# Patient Record
Sex: Male | Born: 1965
Health system: Southern US, Community
[De-identification: ages and names within clinical notes are randomized; demographics above are authoritative.]

## PROBLEM LIST (undated history)

## (undated) DIAGNOSIS — C4490 Unspecified malignant neoplasm of skin, unspecified: Secondary | ICD-10-CM

## (undated) DIAGNOSIS — E7800 Pure hypercholesterolemia, unspecified: Secondary | ICD-10-CM

## (undated) DIAGNOSIS — I10 Essential (primary) hypertension: Secondary | ICD-10-CM

## (undated) HISTORY — DX: Pure hypercholesterolemia, unspecified: E78.00

## (undated) HISTORY — DX: Essential (primary) hypertension: I10

---

## 1996-11-02 HISTORY — PX: BACK SURGERY: SHX140

## 2007-11-05 ENCOUNTER — Emergency Department (HOSPITAL_COMMUNITY): Admission: EM | Admit: 2007-11-05 | Discharge: 2007-11-05 | Payer: Self-pay | Admitting: Emergency Medicine

## 2007-12-12 ENCOUNTER — Emergency Department (HOSPITAL_COMMUNITY): Admission: EM | Admit: 2007-12-12 | Discharge: 2007-12-12 | Payer: Self-pay | Admitting: Family Medicine

## 2007-12-14 ENCOUNTER — Emergency Department (HOSPITAL_COMMUNITY): Admission: EM | Admit: 2007-12-14 | Discharge: 2007-12-14 | Payer: Self-pay | Admitting: Family Medicine

## 2007-12-15 ENCOUNTER — Ambulatory Visit (HOSPITAL_COMMUNITY): Admission: EM | Admit: 2007-12-15 | Discharge: 2007-12-16 | Payer: Self-pay | Admitting: Emergency Medicine

## 2008-01-26 ENCOUNTER — Emergency Department (HOSPITAL_COMMUNITY): Admission: EM | Admit: 2008-01-26 | Discharge: 2008-01-26 | Payer: Self-pay | Admitting: Emergency Medicine

## 2008-09-16 IMAGING — CR DG FINGER MIDDLE 2+V*R*
3 series · 3 of 3 positions shown · non-contrast
Comparison: none

CLINICAL DATA: Sore on finger two days ago.  Now swollen and infected.
 RIGHT MIDDLE FINGER SERIES ? 3 VIEWS ? 12/15/07:

[x finger pa right]
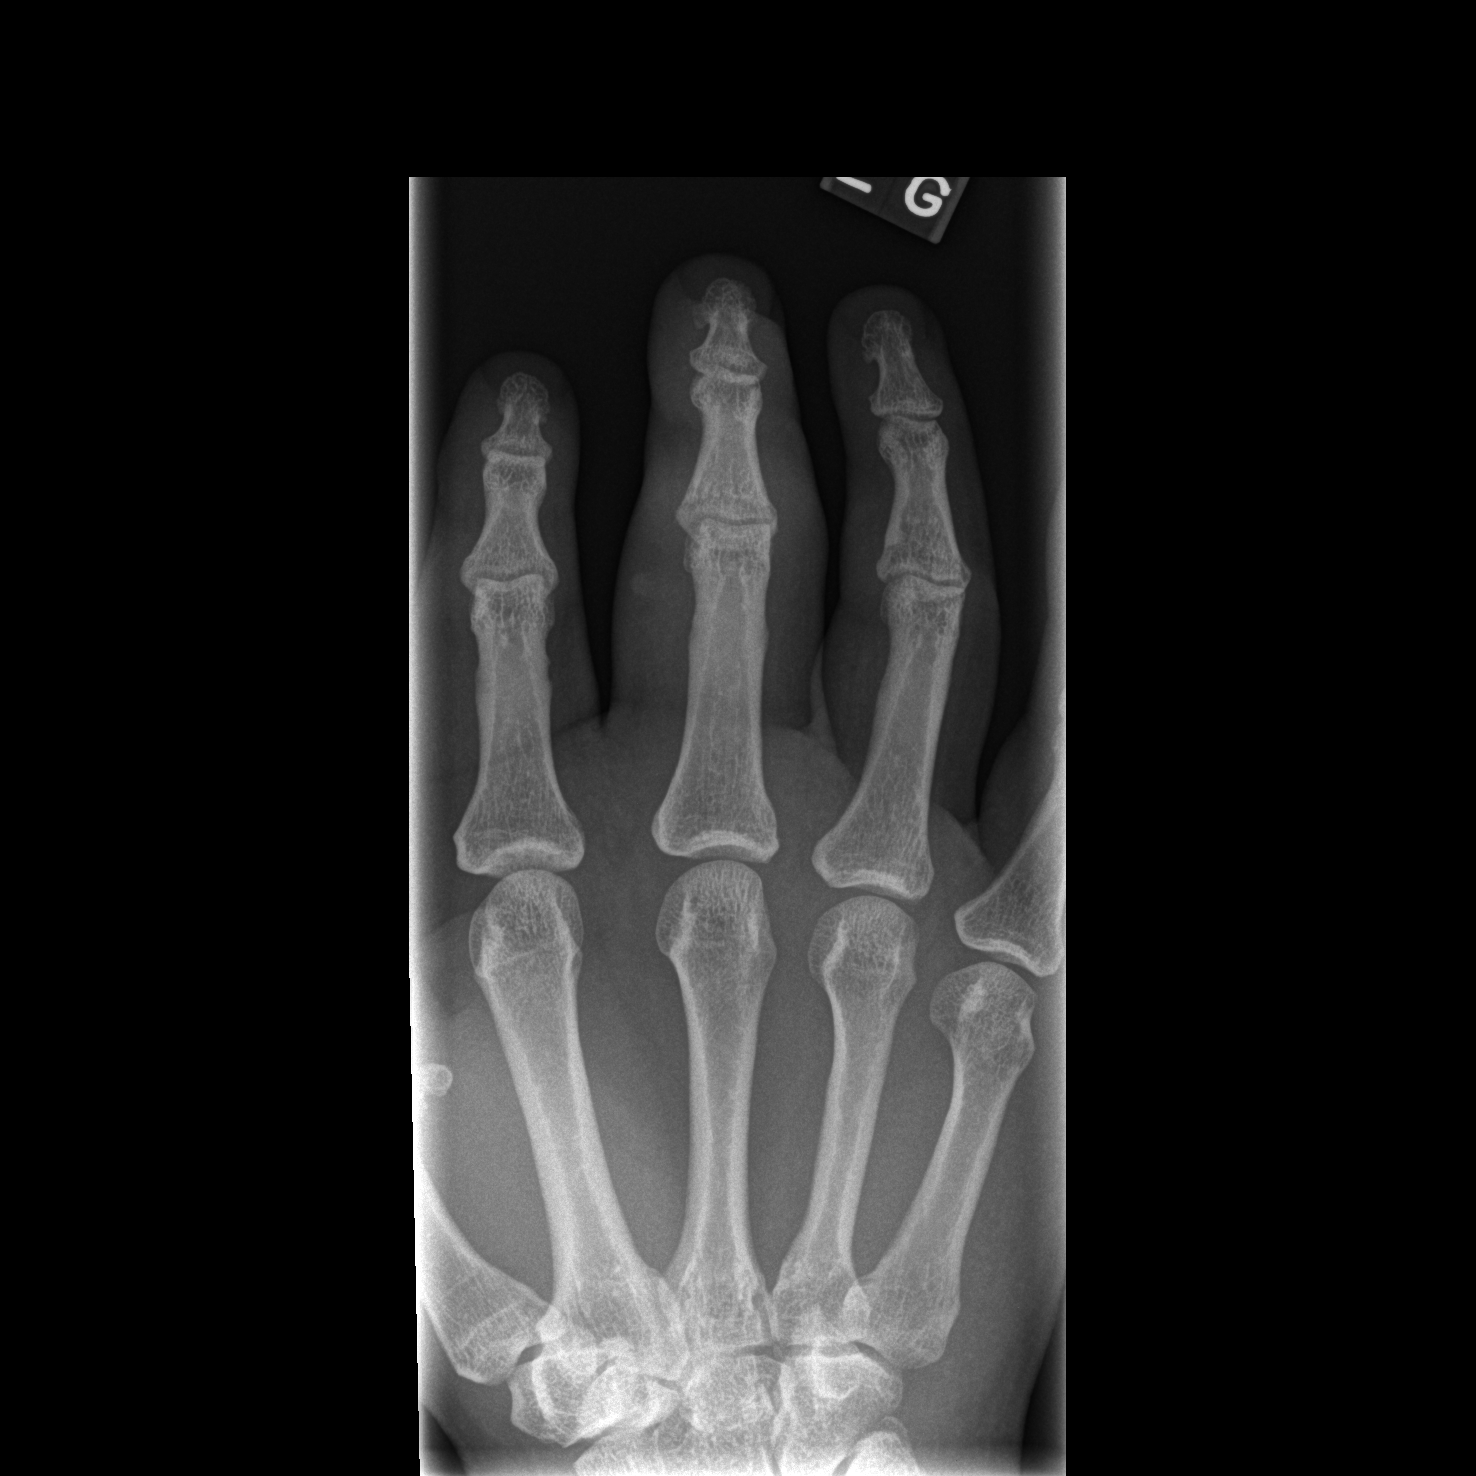

[x finger obl. right]
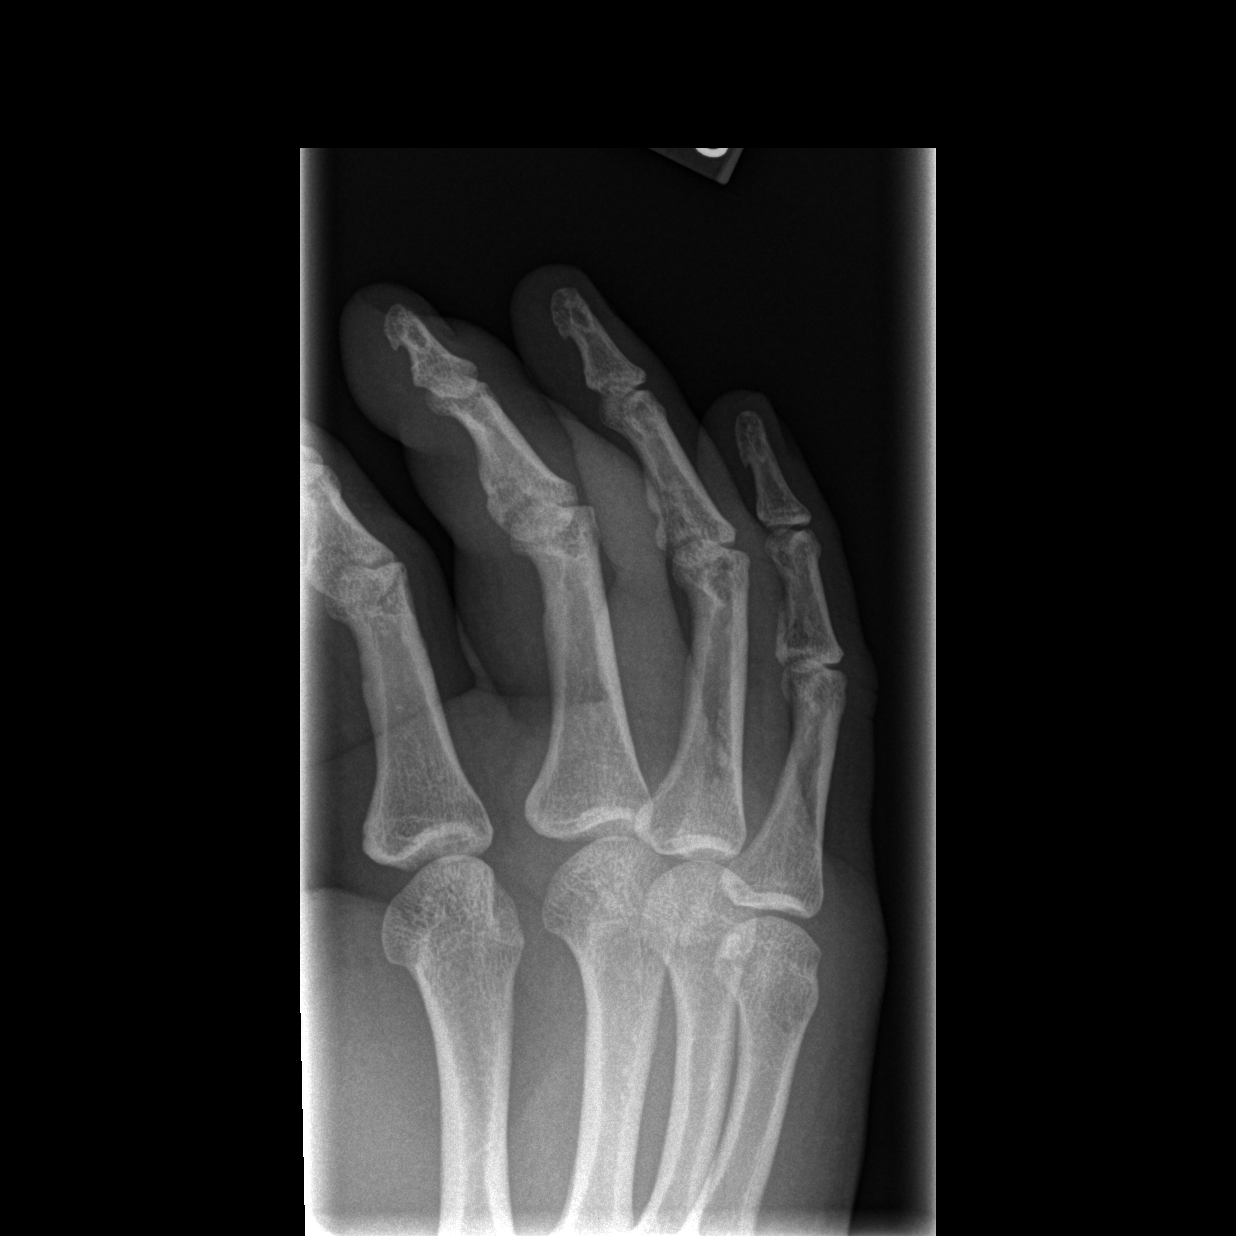

[x finger lateral right]
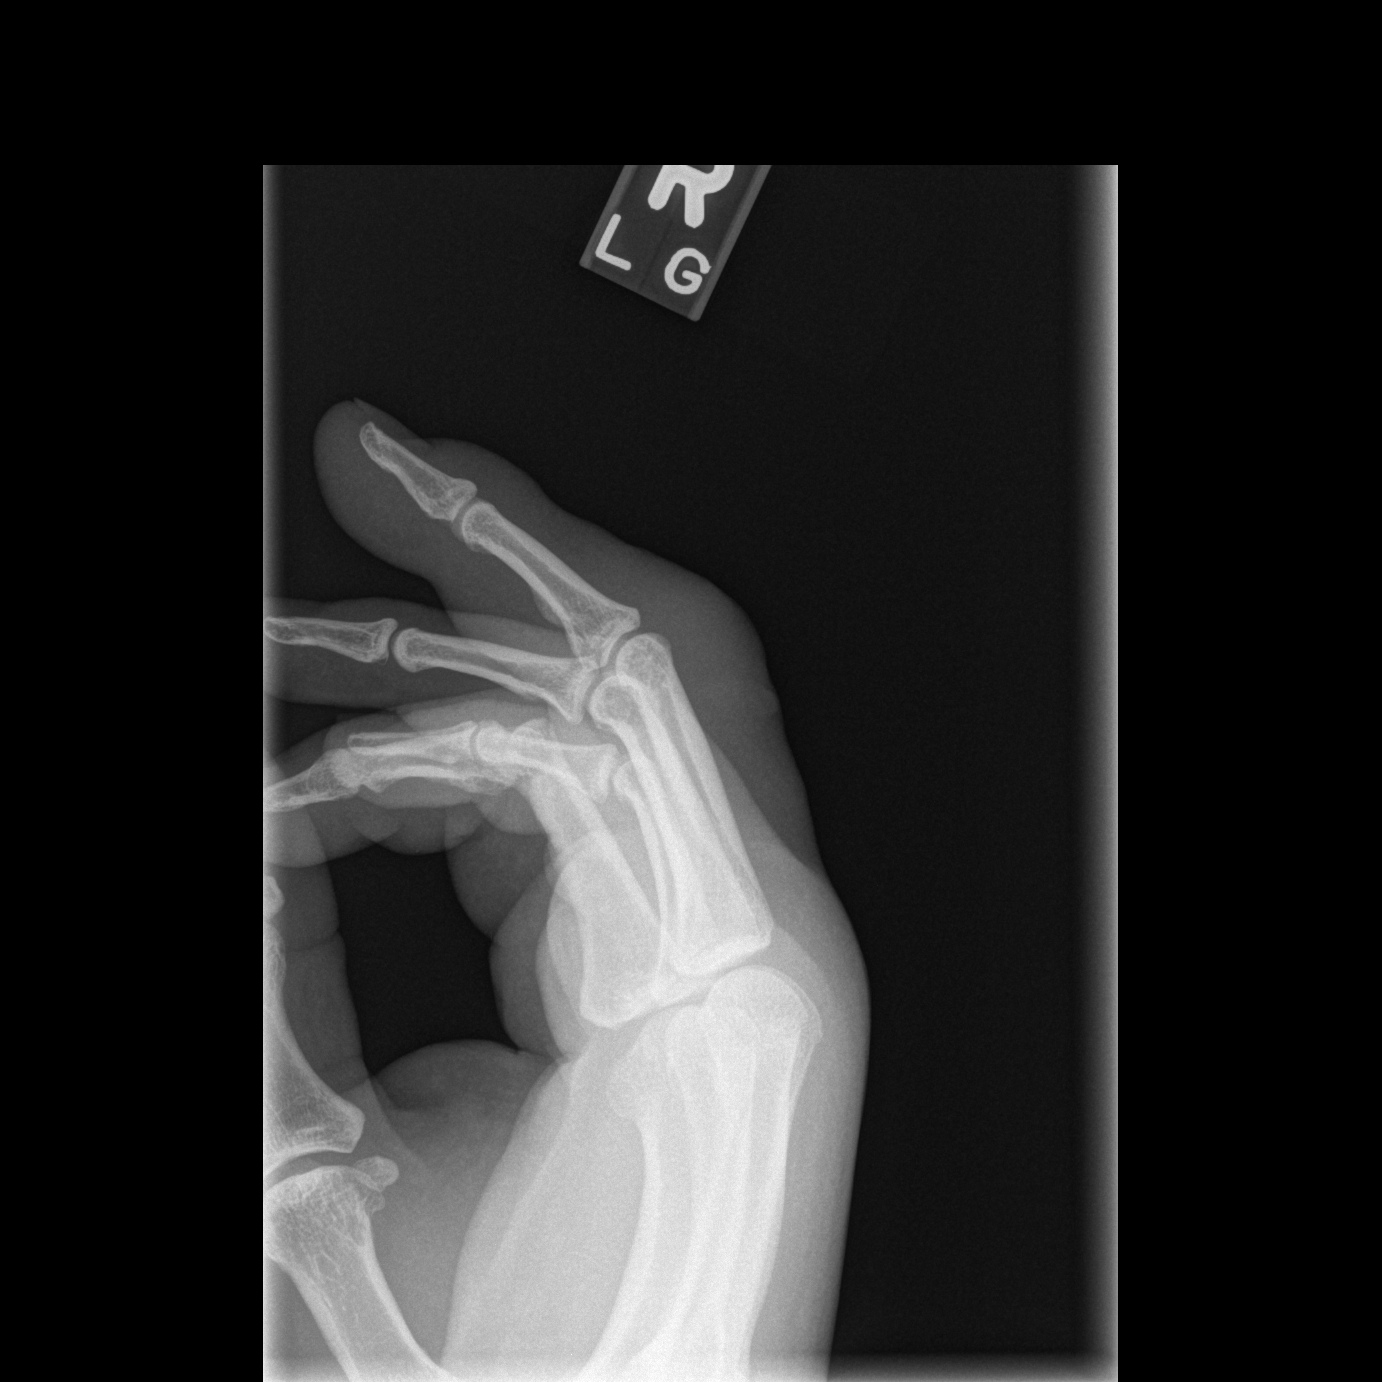

[3 of 3 positions shown; findings below may reference images not displayed]

FINDINGS: There is marked swelling of the right middle finger without plain film evidence of osteomyelitis.  No radiopaque foreign object.
IMPRESSION: 1.  Marked swelling of the right middle finger.  
 2.  No plain film evidence of osteomyelitis.
 3.  No radiopaque foreign object.
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
## 2011-03-17 NOTE — Op Note (Signed)
NAME:  John Kaiser, John Kaiser                  ACCOUNT NO.:  [id number]   MEDICAL RECORD NO.:  [id number]          PATIENT TYPE:  EMS   LOCATION:  ED                           FACILITY:  [facility]   PHYSICIAN:  Johnette Abraham, MD    DATE OF BIRTH:  19-Apr-1966   DATE OF PROCEDURE:  12/15/2007  DATE OF DISCHARGE:  12/16/2007                               OPERATIVE REPORT   PREOPERATIVE DIAGNOSIS:  Deep abscess infection and cellulitis of the  right long finger and right hand.   POSTOPERATIVE DIAGNOSIS:  Deep abscess infection and cellulitis of the  right long finger and right hand.   PROCEDURE:  1. Incision and drainage of deep abscess and extensor tenosynovitis of      the right long finger.  2. Culture of the wound.  3. Packing with Iodoform gauze.   ANESTHESIA:  General.   INDICATIONS:  Mr. Budzynski is a 45 year old gentleman who noticed a small  pimple that came up on his right long finger a couple of days ago.  He  scratched the pimple and his hand has become progressively more swollen,  red, and painful.  He presented to the Urgent Care where the wound was  cultured.  He was placed on antibiotics.  He represented back today with  worsening signs and symptoms.  Urgent evaluation in the emergency  department revealed a tense, right long finger with obvious deep  infection and cellulitis extending all the way up the dorsum of his  hand.  Discussions were had with the patient and the patient's mother  regarding the severity of the infection and the need for urgent  drainage, as well as the possible complications.  They agreed to  proceed.  Consent was obtained.   PROCEDURE IN DETAIL:  The patient was taken to the operating room,  placed supine on the operating room table.  Hand table was used.  The  right upper extremity was prepped and draped in the normal sterile  fashion.  On examination the right long finger on the dorsum overlying  the proximal phalanx extending from the PIP back to the  metacarpophalangeal joint.  There was an are of intense redness and  blister formation right over the extensor tendon.  An incision was made  along the radial side of the hand extending from just proximal to the  PIP joint back to the metacarpophalangeal joint.  Blunt dissection was  carried down to the periosteum.  Immediately there was quite a bit of  purulence that was expressed.  This was drained and both aerobic and  anaerobic cultures were sent.  Further dissection both proximally and  distally revealed extension of the infection back to at least the  metacarpophalangeal joint and up to the PIP joint.  This did involve the  extensor tendon sheath.  Thorough irrigation was then performed, milking  the finger of all residual purulence.  Antibiotic irrigation was used.  Afterwards direct pressure was used to control hemostasis.  Following,  1/4-inch Iodoform gauze was used to pack the wound completely.  A small  piece of  Xeroform was placed and a sterile dressing.  Of note, the  patient received IV vancomycin prior to the procedure.   FINDINGS:  1. Extensive deep abscess of the right long finger with cellulitis      extending to the dorsum of the hand.  2. Attenuated and questionable skin along the dorsum of the right long      finger, extending from just distal to the metacarpophalangeal      joint, up to the PIP joint.  There were no acute complications.      Estimated blood loss was 10 mL.   The patient tolerated the procedure well and was taken to the recovery  room in stable condition.  The patient will be instructed on wound care  and will see me and again remain on oral antibiotics, and will see me no  later than Monday.      Johnette Abraham, MD  Electronically Signed     HCC/MEDQ  D:  12/16/2007  T:  12/17/2007  Job:  [id number]

## 2011-07-23 LAB — WOUND CULTURE

## 2011-07-24 LAB — DIFFERENTIAL
Basophils Absolute: 0.1
Eosinophils Absolute: 0.2
Eosinophils Relative: 2
Lymphocytes Relative: 17
Lymphs Abs: 2.7
Monocytes Absolute: 1.2 — ABNORMAL HIGH

## 2011-07-24 LAB — WOUND CULTURE

## 2011-07-24 LAB — CBC
HCT: 41.5
Hemoglobin: 14.8
MCV: 88.8
Platelets: 246
RDW: 12.6

## 2011-07-24 LAB — BASIC METABOLIC PANEL
BUN: 22
Chloride: 103
Glucose, Bld: 111 — ABNORMAL HIGH
Potassium: 3.9
Sodium: 134 — ABNORMAL LOW

## 2011-07-24 LAB — CULTURE, ROUTINE-ABSCESS: Gram Stain: NONE SEEN

## 2015-06-12 ENCOUNTER — Telehealth: Payer: Self-pay | Admitting: Internal Medicine

## 2015-06-12 NOTE — Telephone Encounter (Signed)
Is requesting Dr. Alain Marion to take him on as a patient.  States his mom, Timoteo Expose is a current patient.

## 2015-06-13 NOTE — Telephone Encounter (Signed)
Ok thx.

## 2015-06-27 ENCOUNTER — Other Ambulatory Visit (INDEPENDENT_AMBULATORY_CARE_PROVIDER_SITE_OTHER): Payer: 59

## 2015-06-27 ENCOUNTER — Encounter: Payer: Self-pay | Admitting: Internal Medicine

## 2015-06-27 ENCOUNTER — Ambulatory Visit (INDEPENDENT_AMBULATORY_CARE_PROVIDER_SITE_OTHER): Payer: 59 | Admitting: Internal Medicine

## 2015-06-27 VITALS — BP 148/80 | HR 51 | Temp 99.0°F | Ht 71.0 in | Wt 235.0 lb

## 2015-06-27 DIAGNOSIS — R0683 Snoring: Secondary | ICD-10-CM

## 2015-06-27 DIAGNOSIS — Z8249 Family history of ischemic heart disease and other diseases of the circulatory system: Secondary | ICD-10-CM

## 2015-06-27 DIAGNOSIS — R030 Elevated blood-pressure reading, without diagnosis of hypertension: Secondary | ICD-10-CM | POA: Diagnosis not present

## 2015-06-27 DIAGNOSIS — D485 Neoplasm of uncertain behavior of skin: Secondary | ICD-10-CM | POA: Diagnosis not present

## 2015-06-27 DIAGNOSIS — Z23 Encounter for immunization: Secondary | ICD-10-CM | POA: Diagnosis not present

## 2015-06-27 DIAGNOSIS — H6123 Impacted cerumen, bilateral: Secondary | ICD-10-CM

## 2015-06-27 DIAGNOSIS — R7989 Other specified abnormal findings of blood chemistry: Secondary | ICD-10-CM

## 2015-06-27 DIAGNOSIS — E785 Hyperlipidemia, unspecified: Secondary | ICD-10-CM

## 2015-06-27 DIAGNOSIS — Z0000 Encounter for general adult medical examination without abnormal findings: Secondary | ICD-10-CM | POA: Diagnosis not present

## 2015-06-27 DIAGNOSIS — IMO0001 Reserved for inherently not codable concepts without codable children: Secondary | ICD-10-CM

## 2015-06-27 DIAGNOSIS — H6120 Impacted cerumen, unspecified ear: Secondary | ICD-10-CM | POA: Insufficient documentation

## 2015-06-27 LAB — CBC WITH DIFFERENTIAL/PLATELET
Basophils Absolute: 0 10*3/uL (ref 0.0–0.1)
Basophils Relative: 0.5 % (ref 0.0–3.0)
EOS PCT: 2.7 % (ref 0.0–5.0)
Eosinophils Absolute: 0.3 10*3/uL (ref 0.0–0.7)
HCT: 45.3 % (ref 39.0–52.0)
Hemoglobin: 15.7 g/dL (ref 13.0–17.0)
LYMPHS ABS: 2.9 10*3/uL (ref 0.7–4.0)
Lymphocytes Relative: 31.1 % (ref 12.0–46.0)
MCHC: 34.6 g/dL (ref 30.0–36.0)
MCV: 90.4 fl (ref 78.0–100.0)
MONO ABS: 0.6 10*3/uL (ref 0.1–1.0)
MONOS PCT: 6.1 % (ref 3.0–12.0)
NEUTROS ABS: 5.5 10*3/uL (ref 1.4–7.7)
NEUTROS PCT: 59.6 % (ref 43.0–77.0)
PLATELETS: 223 10*3/uL (ref 150.0–400.0)
RBC: 5.01 Mil/uL (ref 4.22–5.81)
RDW: 12.8 % (ref 11.5–15.5)
WBC: 9.2 10*3/uL (ref 4.0–10.5)

## 2015-06-27 LAB — URINALYSIS, ROUTINE W REFLEX MICROSCOPIC
BILIRUBIN URINE: NEGATIVE
Ketones, ur: NEGATIVE
Leukocytes, UA: NEGATIVE
NITRITE: NEGATIVE
PH: 5.5 (ref 5.0–8.0)
Specific Gravity, Urine: >=1.03 — AB (ref 1.000–1.030)
Total Protein, Urine: 30 — AB
Urine Glucose: NEGATIVE
Urobilinogen, UA: 0.2 (ref 0.0–1.0)

## 2015-06-27 LAB — HEPATIC FUNCTION PANEL
ALK PHOS: 42 U/L (ref 39–117)
ALT: 31 U/L (ref 0–53)
AST: 21 U/L (ref 0–37)
Albumin: 4.5 g/dL (ref 3.5–5.2)
BILIRUBIN DIRECT: 0.1 mg/dL (ref 0.0–0.3)
BILIRUBIN TOTAL: 0.5 mg/dL (ref 0.2–1.2)
TOTAL PROTEIN: 7.5 g/dL (ref 6.0–8.3)

## 2015-06-27 LAB — BASIC METABOLIC PANEL
BUN: 15 mg/dL (ref 6–23)
CALCIUM: 9.7 mg/dL (ref 8.4–10.5)
CO2: 28 mEq/L (ref 19–32)
CREATININE: 1.07 mg/dL (ref 0.40–1.50)
Chloride: 104 mEq/L (ref 96–112)
GFR: 77.5 mL/min (ref >60.00)
Glucose, Bld: 101 mg/dL — ABNORMAL HIGH (ref 70–99)
Potassium: 4.7 mEq/L (ref 3.5–5.1)
SODIUM: 139 meq/L (ref 135–145)

## 2015-06-27 LAB — LIPID PANEL
Cholesterol: 288 mg/dL — ABNORMAL HIGH (ref 0–200)
HDL: 33.7 mg/dL — AB (ref >39.00)
NONHDL: 254.7
TRIGLYCERIDES: 371 mg/dL — AB (ref 0.0–149.0)
Total CHOL/HDL Ratio: 9
VLDL: 74.2 mg/dL — AB (ref 0.0–40.0)

## 2015-06-27 LAB — LDL CHOLESTEROL, DIRECT: Direct LDL: 178 mg/dL

## 2015-06-27 LAB — PSA: PSA: 2.48 ng/mL (ref 0.10–4.00)

## 2015-06-27 LAB — TSH: TSH: 1.44 u[IU]/mL (ref 0.35–4.50)

## 2015-06-27 MED ORDER — ATORVASTATIN CALCIUM 10 MG PO TABS: 10.0000 mg | ORAL_TABLET | Freq: Every day | ORAL | Status: DC

## 2015-06-27 MED ORDER — VITAMIN D3 50 MCG (2000 UT) PO CAPS: 2000.0000 [IU] | ORAL_CAPSULE | Freq: Every day | ORAL | Status: DC

## 2015-06-27 MED ORDER — RANITIDINE HCL 150 MG PO TABS: 150.0000 mg | ORAL_TABLET | Freq: Two times a day (BID) | ORAL | Status: DC

## 2015-06-27 NOTE — Progress Notes (Signed)
Pre visit review using our clinic review tool, if applicable. No additional management support is needed unless otherwise documented below in the visit note. 

## 2015-06-27 NOTE — Assessment & Plan Note (Signed)
8/16 Start Lipitor

## 2015-06-27 NOTE — Assessment & Plan Note (Addendum)
Pulm ref to r/o OSA Contour pillow Zantac
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
## 2015-06-27 NOTE — Assessment & Plan Note (Signed)
Skin bx soon

## 2015-06-27 NOTE — Assessment & Plan Note (Signed)
Monitor BP at home RTC 3 mo

## 2015-06-27 NOTE — Assessment & Plan Note (Signed)
Will irrigate 

## 2015-06-27 NOTE — Assessment & Plan Note (Addendum)
F died at 49 yo MI Allergic to ASA EKG nl Treat OSA Labs

## 2015-06-28 ENCOUNTER — Other Ambulatory Visit: Payer: Self-pay | Admitting: Internal Medicine

## 2015-06-28 NOTE — Telephone Encounter (Signed)
Pt mother called in and would like to know if his liptor to Probucol??   Pt can not take the liptor due to side effects.

## 2015-07-01 NOTE — Telephone Encounter (Signed)
Left mess for patient to call back- need clarification on what is being asked.

## 2015-07-01 NOTE — Telephone Encounter (Signed)
OK Pravachol start 1/2 tab qd x 1 wk, then 1 po qd It would be best if the pt communicates to me himself Thx

## 2015-07-02 MED ORDER — PRAVASTATIN SODIUM 40 MG PO TABS: 40.0000 mg | ORAL_TABLET | Freq: Every day | ORAL | Status: DC

## 2015-07-02 NOTE — Telephone Encounter (Signed)
Called pt no answer LMOM withmd response. Rx sent to [facility]...[name]

## 2015-07-09 ENCOUNTER — Ambulatory Visit (INDEPENDENT_AMBULATORY_CARE_PROVIDER_SITE_OTHER): Payer: 59 | Admitting: Internal Medicine

## 2015-07-09 ENCOUNTER — Encounter: Payer: Self-pay | Admitting: Internal Medicine

## 2015-07-09 VITALS — BP 134/84 | HR 79 | Wt 236.0 lb

## 2015-07-09 DIAGNOSIS — C439 Malignant melanoma of skin, unspecified: Secondary | ICD-10-CM

## 2015-07-09 DIAGNOSIS — D485 Neoplasm of uncertain behavior of skin: Secondary | ICD-10-CM

## 2015-07-09 DIAGNOSIS — E785 Hyperlipidemia, unspecified: Secondary | ICD-10-CM
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
## 2015-07-09 NOTE — Assessment & Plan Note (Signed)
Pravachol

## 2015-07-09 NOTE — Patient Instructions (Signed)
Postprocedure instructions :    A Band-Aid should be  changed twice daily. You can take a shower tomorrow.  Keep the wounds clean. You can wash them with liquid soap and water. Pat dry with gauze or a Kleenex tissue  Before applying antibiotic ointment and a Band-Aid.   You need to report immediately  if fever, chills or any signs of infection develop.    The biopsy results should be available in 1 -2 weeks. 

## 2015-07-09 NOTE — Assessment & Plan Note (Signed)
bx x2

## 2015-07-09 NOTE — Progress Notes (Signed)
Pre visit review using our clinic review tool, if applicable. No additional management support is needed unless otherwise documented below in the visit note. 

## 2015-07-10 ENCOUNTER — Telehealth: Payer: Self-pay | Admitting: *Deleted

## 2015-07-10 NOTE — Telephone Encounter (Signed)
Caller giving verbal report on 07/09/15 specimen. Path report shows specimen # 1 on Right shoulder blade is malignant melanoma. Clarks level 4. Tumor thickness 1.1 mm. Limited margins are free.

## 2015-07-11 ENCOUNTER — Encounter: Payer: Self-pay | Admitting: Internal Medicine

## 2015-07-11 ENCOUNTER — Other Ambulatory Visit: Payer: Self-pay | Admitting: Internal Medicine

## 2015-07-11 DIAGNOSIS — C439 Malignant melanoma of skin, unspecified: Secondary | ICD-10-CM

## 2015-07-11 NOTE — Assessment & Plan Note (Signed)
[facility]x

## 2015-07-12 NOTE — Telephone Encounter (Signed)
Pt was ref to [facility]

## 2015-07-15 ENCOUNTER — Ambulatory Visit (INDEPENDENT_AMBULATORY_CARE_PROVIDER_SITE_OTHER): Payer: 59 | Admitting: Internal Medicine

## 2015-07-15 ENCOUNTER — Encounter: Payer: Self-pay | Admitting: Internal Medicine

## 2015-07-15 VITALS — BP 132/78 | HR 65 | Ht 72.0 in | Wt 236.2 lb

## 2015-07-15 DIAGNOSIS — R030 Elevated blood-pressure reading, without diagnosis of hypertension: Secondary | ICD-10-CM

## 2015-07-15 DIAGNOSIS — IMO0001 Reserved for inherently not codable concepts without codable children: Secondary | ICD-10-CM

## 2015-07-15 DIAGNOSIS — G4733 Obstructive sleep apnea (adult) (pediatric): Secondary | ICD-10-CM

## 2015-07-15 NOTE — Patient Instructions (Signed)
Order- unattended home sleep study    Dx OSA  Office follow-up to be scheduled for review of test results

## 2015-07-15 NOTE — Assessment & Plan Note (Signed)
Managed by Primary Physician. Discussed potential significance if he has sleep apnea

## 2015-07-15 NOTE — Assessment & Plan Note (Signed)
Probable diagnosis pending sleep study

## 2015-07-15 NOTE — Progress Notes (Signed)
07/14/16- 49 yo never smoker Dr Alain Marion; no sleep study; snoring, gasping for air Lives alone, but told of loud snoring, witnessed apnea. Admits daytime sleepiness. Does sometimes work long hours. Little caffeine. No ENT surgery. Sleep habit ok. Medical management for HBP and GERD, but denies heart or lung disease. Declines flu shot  Prior to Admission medications   Medication Sig Start Date End Date Taking? Authorizing Provider  Cholecalciferol (VITAMIN D3) 2000 UNITS capsule Take 1 capsule (2,000 Units total) by mouth daily. 06/27/15  Yes Aleksei Plotnikov V, MD  ibuprofen (ADVIL,MOTRIN) 200 MG tablet Take 200 mg by mouth every 6 (six) hours as needed.   Yes Historical Provider, MD  pravastatin (PRAVACHOL) 40 MG tablet Take 1 tablet (40 mg total) by mouth daily. 07/02/15  Yes Aleksei Plotnikov V, MD  Probiotic Product (PROBIOTIC DAILY PO) Take by mouth 3 (three) times a week.   Yes Historical Provider, MD  ranitidine (ZANTAC) 150 MG tablet Take 1 tablet (150 mg total) by mouth 2 (two) times daily. Patient taking differently: Take 150 mg by mouth at bedtime.  06/27/15  Yes Cassandria Anger, MD   Past Medical History  Diagnosis Date  . High blood pressure   . High cholesterol    Past Surgical History  Procedure Laterality Date  . Back surgery  1998   Family History  Problem Relation Age of Onset  . Arthritis Mother   . Depression Mother   . Heart disease Father 22    MI  . Hypertension Father   . Hyperlipidemia Father   . Diabetes Sister   . Diabetes Paternal Grandmother    Social History   Social History  . Marital Status: Single    Spouse Name: N/A  . Number of Children: N/A  . Years of Education: N/A   Occupational History  . Not on file.   Social History Main Topics  . Smoking status: Never Smoker   . Smokeless tobacco: Not on file  . Alcohol Use: 0.0 oz/week    0 Standard drinks or equivalent per week     Comment: social  . Drug Use: No     Comment: smoked  marijuana in high school  . Sexual Activity: Not on file   Other Topics Concern  . Not on file   Social History Narrative   ROS-see HPI   Negative unless "+" Constitutional:    weight loss, night sweats, fevers, chills,+ fatigue, lassitude. HEENT:    headaches, difficulty swallowing, tooth/dental problems, sore throat,       sneezing, itching, ear ache, nasal congestion, post nasal drip, snoring CV:    chest pain, orthopnea, PND, swelling in lower extremities, anasarca,                                                     dizziness, palpitations Resp:   shortness of breath with exertion or at rest.                productive cough,   non-productive cough, coughing up of blood.              change in color of mucus.  wheezing.   Skin:    rash or lesions. GI:  No-   heartburn, indigestion, abdominal pain, nausea, vomiting, diarrhea,  change in bowel habits, loss of appetite GU: dysuria, change in color of urine, no urgency or frequency.   flank pain. MS:   joint pain, stiffness, decreased range of motion, back pain. Neuro-     nothing unusual Psych:  change in mood or affect.  depression or anxiety.   memory loss.  OBJ- Physical Exam General- Alert, Oriented, Affect-appropriate, Distress- none acute Skin- rash-none, lesions- none, excoriation- none Lymphadenopathy- none Head- atraumatic            Eyes- Gross vision intact, PERRLA, conjunctivae and secretions clear            Ears- Hearing, canals-normal            Nose- Clear, no-Septal dev, mucus, polyps, erosion, perforation             Throat- Mallampati II-III , mucosa clear , drainage- none, tonsils- atrophic Neck- flexible , trachea midline, no stridor , thyroid nl, carotid no bruit Chest - symmetrical excursion , unlabored           Heart/CV- RRR , no murmur , no gallop  , no rub, nl s1 s2                           - JVD- none , edema- none, stasis changes- none, varices- none           Lung- clear to P&A,  wheeze- none, cough- none , dullness-none, rub- none           Chest wall-  Abd-  Br/ Gen/ Rectal- Not done, not indicated Extrem- cyanosis- none, clubbing, none, atrophy- none, strength- nl Neuro- grossly intact to observation

## 2015-07-16 ENCOUNTER — Encounter: Payer: Self-pay | Admitting: Internal Medicine

## 2015-07-26 DIAGNOSIS — G4733 Obstructive sleep apnea (adult) (pediatric): Secondary | ICD-10-CM | POA: Diagnosis not present

## 2015-08-01 ENCOUNTER — Other Ambulatory Visit: Payer: Self-pay | Admitting: *Deleted

## 2015-08-01 ENCOUNTER — Encounter: Payer: Self-pay | Admitting: Internal Medicine

## 2015-08-01 DIAGNOSIS — G4733 Obstructive sleep apnea (adult) (pediatric): Secondary | ICD-10-CM | POA: Diagnosis not present

## 2015-08-08 ENCOUNTER — Ambulatory Visit: Payer: 59 | Admitting: Internal Medicine

## 2015-09-24 ENCOUNTER — Telehealth: Payer: Self-pay | Admitting: Internal Medicine

## 2015-09-24 DIAGNOSIS — G4733 Obstructive sleep apnea (adult) (pediatric): Secondary | ICD-10-CM

## 2015-09-24 NOTE — Telephone Encounter (Signed)
Spoke with pt or sleep study results. Aware that order placed for new CPAP machine.  Appt scheduled for follow up 11/17/2015 @ 11 Nothing further needed.

## 2015-09-24 NOTE — Telephone Encounter (Signed)
The sleep study confirms severe obstructive sleep apnea. He averaged 61 apnea events per hour. I recommend we order new DME, new CPAP auto 5-20, mask of choice, humidifier, supplies, AirView for dx OSA He needs a return office appointment within 31- 90 days for CPAP follow-up to meet insurance documentation rules. Please see that appointment gets made.

## 2015-09-24 NOTE — Telephone Encounter (Signed)
Patient calling to get results of Sleep Study.  HST results are in [place].  Dr. Annamaria Boots, please advise.

## 2015-09-25 ENCOUNTER — Ambulatory Visit: Payer: 59 | Admitting: Internal Medicine

## 2015-09-25 DIAGNOSIS — S40021A Contusion of right upper arm, initial encounter: Secondary | ICD-10-CM | POA: Insufficient documentation

## 2015-10-01 ENCOUNTER — Other Ambulatory Visit (INDEPENDENT_AMBULATORY_CARE_PROVIDER_SITE_OTHER): Payer: 59

## 2015-10-01 ENCOUNTER — Encounter: Payer: Self-pay | Admitting: Internal Medicine

## 2015-10-01 ENCOUNTER — Ambulatory Visit (INDEPENDENT_AMBULATORY_CARE_PROVIDER_SITE_OTHER): Payer: 59 | Admitting: Internal Medicine

## 2015-10-01 VITALS — BP 142/90 | HR 68 | Wt 235.0 lb

## 2015-10-01 DIAGNOSIS — C4359 Malignant melanoma of other part of trunk: Secondary | ICD-10-CM

## 2015-10-01 DIAGNOSIS — R7989 Other specified abnormal findings of blood chemistry: Secondary | ICD-10-CM

## 2015-10-01 DIAGNOSIS — E785 Hyperlipidemia, unspecified: Secondary | ICD-10-CM

## 2015-10-01 DIAGNOSIS — R030 Elevated blood-pressure reading, without diagnosis of hypertension: Secondary | ICD-10-CM

## 2015-10-01 DIAGNOSIS — B351 Tinea unguium: Secondary | ICD-10-CM

## 2015-10-01 DIAGNOSIS — G4733 Obstructive sleep apnea (adult) (pediatric): Secondary | ICD-10-CM

## 2015-10-01 DIAGNOSIS — IMO0001 Reserved for inherently not codable concepts without codable children: Secondary | ICD-10-CM

## 2015-10-01 DIAGNOSIS — Z23 Encounter for immunization: Secondary | ICD-10-CM

## 2015-10-01 LAB — HEPATIC FUNCTION PANEL
ALK PHOS: 48 U/L (ref 39–117)
ALT: 34 U/L (ref 0–53)
AST: 25 U/L (ref 0–37)
Albumin: 4.5 g/dL (ref 3.5–5.2)
BILIRUBIN DIRECT: 0.1 mg/dL (ref 0.0–0.3)
BILIRUBIN TOTAL: 0.4 mg/dL (ref 0.2–1.2)
Total Protein: 8.3 g/dL (ref 6.0–8.3)

## 2015-10-01 LAB — LIPID PANEL
CHOL/HDL RATIO: 6
CHOLESTEROL: 196 mg/dL (ref 0–200)
HDL: 34.8 mg/dL — ABNORMAL LOW (ref >39.00)
NONHDL: 161.25
Triglycerides: 234 mg/dL — ABNORMAL HIGH (ref 0.0–149.0)
VLDL: 46.8 mg/dL — AB (ref 0.0–40.0)

## 2015-10-01 LAB — LDL CHOLESTEROL, DIRECT: LDL DIRECT: 116 mg/dL

## 2015-10-01 MED ORDER — CICLOPIROX 8 % EX SOLN: Freq: Every day | CUTANEOUS | Status: DC

## 2015-10-01 NOTE — Assessment & Plan Note (Signed)
Index finger [place]
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
## 2015-10-01 NOTE — Assessment & Plan Note (Signed)
Discussed NAS

## 2015-10-01 NOTE — Assessment & Plan Note (Signed)
Labs

## 2015-10-01 NOTE — Assessment & Plan Note (Signed)
Treated at [facility] w/surgery

## 2015-10-01 NOTE — Progress Notes (Signed)
Pre visit review using our clinic review tool, if applicable. No additional management support is needed unless otherwise documented below in the visit note. 

## 2015-10-01 NOTE — Addendum Note (Signed)
Addended by: Cresenciano Lick on: 10/01/2015 12:07 PM   Modules accepted: Orders

## 2015-10-01 NOTE — Assessment & Plan Note (Signed)
Starting a CPAP

## 2015-11-20 ENCOUNTER — Encounter: Payer: Self-pay | Admitting: Internal Medicine

## 2015-11-25 ENCOUNTER — Encounter: Payer: Self-pay | Admitting: Internal Medicine

## 2015-11-25 DIAGNOSIS — G4733 Obstructive sleep apnea (adult) (pediatric): Secondary | ICD-10-CM

## 2015-11-25 NOTE — Telephone Encounter (Signed)
CY - please advise. Thanks! 

## 2015-11-27 NOTE — Telephone Encounter (Signed)
Ok to order his DME to reduce autoPAP to 5-15. Please add AirView if they have not already done so.    Dx OSA  At last communication we were to have made him a follow-up appointment. Please do so- next routine available.

## 2015-11-28 NOTE — Telephone Encounter (Signed)
Order sent to [facility]  I have LMTCB for the pt to make appt with CDY

## 2015-11-28 NOTE — Addendum Note (Signed)
Addended by: Rosana Berger on: 11/28/2015 02:20 PM   Modules accepted: Orders

## 2016-09-22 ENCOUNTER — Encounter: Payer: Self-pay | Admitting: Podiatry

## 2016-09-22 ENCOUNTER — Ambulatory Visit (INDEPENDENT_AMBULATORY_CARE_PROVIDER_SITE_OTHER): Payer: BLUE CROSS/BLUE SHIELD | Admitting: Podiatry

## 2016-09-22 ENCOUNTER — Ambulatory Visit (INDEPENDENT_AMBULATORY_CARE_PROVIDER_SITE_OTHER): Payer: BLUE CROSS/BLUE SHIELD

## 2016-09-22 VITALS — BP 158/97 | HR 67 | Resp 16 | Ht 72.0 in | Wt 225.0 lb

## 2016-09-22 DIAGNOSIS — M722 Plantar fascial fibromatosis: Secondary | ICD-10-CM

## 2016-09-22 DIAGNOSIS — M79672 Pain in left foot: Secondary | ICD-10-CM | POA: Diagnosis not present

## 2016-09-22 NOTE — Patient Instructions (Signed)
Stretches: Changing from a seated to standing position toes toward your nose We shoes on and knee straight leaning against the wall Stretches 30-60 seconds per exercise Wear you plantar fasciitis strap and athletic lace up style shoe  Heel Spur Introduction A heel spur is a bony growth that forms on the bottom of your heel bone (calcaneus). Heel spurs are common and do not always cause pain. However, heel spurs often cause inflammation in the strong band of tissue that runs underneath the bone of your foot (plantar fascia). When this happens, you may feel pain on the bottom of your foot, near your heel. What are the causes? The cause of heel spurs is not completely understood. They may be caused by pressure on the heel. Or, they may stem from the muscle attachments (tendons) near the spur pulling on the heel. What increases the risk? You may be at risk for a heel spur if you:  Are older than 40.  Are overweight.  Have wear and tear arthritis (osteoarthritis).  Have plantar fascia inflammation. What are the signs or symptoms? Some people have heel spurs but no symptoms. If you do have symptoms, they may include:  Pain in the bottom of your heel.  Pain that is worse when you first get out of bed.  Pain that gets worse after walking or standing. How is this diagnosed? Your health care provider may diagnose a heel spur based on your symptoms and a physical exam. You may also have an X-ray of your foot to check for a bony growth coming from the calcaneus. How is this treated? Treatment aims to relieve the pain from the heel spur. This may include:  Stretching exercises.  Losing weight.  Wearing specific shoes, inserts, or orthotics for comfort and support.  Wearing splints at night to properly position your feet.  Taking over-the-counter medicine to relieve pain.  Being treated with high-intensity sound waves to break up the heel spur (extracorporeal shock wave  therapy).  Getting steroid injections in your heel to reduce swelling and ease pain.  Having surgery if your heel spur causes long-term (chronic) pain. Follow these instructions at home:  Take medicines only as directed by your health care provider.  Ask your health care provider if you should use ice or cold packs on the painful areas of your heel or foot.  Avoid activities that cause you pain until you recover or as directed by your health care provider.  Stretch before exercising or being physically active.  Wear supportive shoes that fit well as directed by your health care provider. You might need to buy new shoes. Wearing old shoes or shoes that do not fit correctly may not provide the support that you need.  Lose weight if your health care provider thinks you should. This can relieve pressure on your foot that may be causing pain and discomfort. Contact a health care provider if:  Your pain continues or gets worse. This information is not intended to replace advice given to you by your health care provider. Make sure you discuss any questions you have with your health care provider. Document Released: 11/25/2005 Document Revised: 03/26/2016 Document Reviewed: 12/20/2013  2017 [employer]vier

## 2016-09-22 NOTE — Progress Notes (Signed)
Subjective:    Patient ID: John Kaiser, male    DOB: 04/05/1966, 50 y.o.   MRN: [id number]  HPI As patient presents today with a six-month history of left inferior heel pain. He describes pain and initial weightbearing when he changes from a seated to a standing position. As he continues to walk and generally dissipates, however, never totally resolves. He said that he went to another doctor who prescribed an unknown anti-inflammatory medication which he took for 1 month without any significant improvement of the left inferior heel pain. He denies any direct injury or change of activity. 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  All other systems reviewed and are negative.      Objective:   Physical Exam  Orientated 3  Vascular: No peripheral edema or calf tenderness bilaterally DP and PT pulses 2/4 bilaterally Capillary reflex immediate bilaterally  Neurological: Sensation to 10 g monofilament wire intact 5/5 bilaterally Vibratory sensation reactive bilaterally Ankle reflex equal and reactive bilaterally  Dermatological: No open skin lesions bilaterally Texture and turgor within normal limits bilaterally Fat pad MPJ heel adequate bilaterally  Musculoskeletal: Exquisite palpable tenderness medial central lateral fascial band in around insertional area without any palpable lesions. This duplicates area of discomfort. Is no restriction ankle, subtalar, midtarsal joints bilaterally Manual motor testing dorsi flexion, plantar flexion, inversion, eversion 5/5 bilaterally  X-ray examination weightbearing left foot dated 09/22/2016  Intact bony structures without fracture and/or dislocation Well-organized inferior calcaneal spur Calcification distal tendo Achilles Bone density appears adequate Joint spaces adequate No increased soft tissue density  noted  Radiographic impression: No acute bony abnormality noted in the weightbearing x-ray left foot dated 09/22/2016      Assessment & Plan:   Assessment: Plantar fasciitis left  Plan: Today I reviewed the results of the exam and x-ray with patient discussing plantar fasciitis in detail. I offered patient Kenalog injection he verbally consents  Skin is prepped with alcohol and Betadine and 10 mg of Kenalog mixed with 10 mg of plain Xylocaine and 2.5 mg of plain Sensorcaine were injected inferior heel left for Kenalog injection #1. Patient tolerated procedure without any difficulty  Plantar fasciitis dispensed with wearing instruction provided Shoeing and stretching discussed in detail  Reappoint 4 weeks

## 2016-10-28 ENCOUNTER — Encounter: Payer: Self-pay | Admitting: Podiatry

## 2016-10-28 ENCOUNTER — Ambulatory Visit (INDEPENDENT_AMBULATORY_CARE_PROVIDER_SITE_OTHER): Payer: BLUE CROSS/BLUE SHIELD | Admitting: Podiatry

## 2016-10-28 VITALS — BP 170/84 | HR 80 | Resp 18

## 2016-10-28 DIAGNOSIS — M722 Plantar fascial fibromatosis: Secondary | ICD-10-CM | POA: Diagnosis not present

## 2016-10-28 NOTE — Progress Notes (Signed)
Subjective:    Patient ID: John Kaiser, male    DOB: [birth year], 50 y.o.   MRN: [id number]  HPI     This patient presents today for follow-up care for plantar fasciitis from the visit of 09/22/2016. At that visit a Kenalog injection was given inferior left heel and shoeing and stretching and exercise discussed. Patient states that pretreatment level pain was 10/10 and now is 2/10.  Review of Systems  All other systems reviewed and are negative.      Objective:   Physical Exam  Orientated 3  Vascular: No peripheral edema or calf tenderness bilaterally DP and PT pulses 2/4 bilaterally Capillary reflex immediate bilaterally  Neurological: Sensation to 10 g monofilament wire intact 5/5 bilaterally Vibratory sensation reactive bilaterally Ankle reflex equal and reactive bilaterally  Dermatological: No open skin lesions bilaterally Texture and turgor within normal limits bilaterally Fat pad MPJ heel adequate bilaterally  Musculoskeletal: There are no areas of palpable tenderness or palpable lesions in the fascial area left INo restriction ankle, subtalar, midtarsal joints bilaterally Manual motor testing dorsi flexion, plantar flexion, inversion, eversion 5/5 bilaterally  X-ray examination weightbearing left foot dated 09/22/2016  Intact bony structures without fracture and/or dislocation Well-organized inferior calcaneal spur Calcification distal tendo Achilles Bone density appears adequate Joint spaces adequate No increased soft tissue density noted  Radiographic impression: No acute bony abnormality noted in the weightbearing x-ray left foot dated 09/22/2016      Assessment & Plan:   Assessment: Improved plantar fasciitis left  Plan: Today I reviewed the results of exam with patient today. I instructed him to maintain correct shoeing, stretching and adjust his discretionary athletic activity to [place] tolerance  Patient discharged with instructions to  reappoint at his request

## 2016-10-28 NOTE — Progress Notes (Signed)
Patient ID: John Kaiser, male   DOB: 11/18/1965, 50 y.o.   MRN: [id number]

## 2016-10-28 NOTE — Patient Instructions (Signed)
The pain associated with the plantar fasciitis in her left heel has improved. Maintain stretching, athletic style shoes and adjust discretionary athletic activity to your tolerance Reappoint at your request  Heel Spur Introduction A heel spur is a bony growth that forms on the bottom of your heel bone (calcaneus). Heel spurs are common and do not always cause pain. However, heel spurs often cause inflammation in the strong band of tissue that runs underneath the bone of your foot (plantar fascia). When this happens, you may feel pain on the bottom of your foot, near your heel. What are the causes? The cause of heel spurs is not completely understood. They may be caused by pressure on the heel. Or, they may stem from the muscle attachments (tendons) near the spur pulling on the heel. What increases the risk? You may be at risk for a heel spur if you:  Are older than 40.  Are overweight.  Have wear and tear arthritis (osteoarthritis).  Have plantar fascia inflammation. What are the signs or symptoms? Some people have heel spurs but no symptoms. If you do have symptoms, they may include:  Pain in the bottom of your heel.  Pain that is worse when you first get out of bed.  Pain that gets worse after walking or standing. How is this diagnosed? Your health care provider may diagnose a heel spur based on your symptoms and a physical exam. You may also have an X-ray of your foot to check for a bony growth coming from the calcaneus. How is this treated? Treatment aims to relieve the pain from the heel spur. This may include:  Stretching exercises.  Losing weight.  Wearing specific shoes, inserts, or orthotics for comfort and support.  Wearing splints at night to properly position your feet.  Taking over-the-counter medicine to relieve pain.  Being treated with high-intensity sound waves to break up the heel spur (extracorporeal shock wave therapy).  Getting steroid injections in  your heel to reduce swelling and ease pain.  Having surgery if your heel spur causes long-term (chronic) pain. Follow these instructions at home:  Take medicines only as directed by your health care provider.  Ask your health care provider if you should use ice or cold packs on the painful areas of your heel or foot.  Avoid activities that cause you pain until you recover or as directed by your health care provider.  Stretch before exercising or being physically active.  Wear supportive shoes that fit well as directed by your health care provider. You might need to buy new shoes. Wearing old shoes or shoes that do not fit correctly may not provide the support that you need.  Lose weight if your health care provider thinks you should. This can relieve pressure on your foot that may be causing pain and discomfort. Contact a health care provider if:  Your pain continues or gets worse. This information is not intended to replace advice given to you by your health care provider. Make sure you discuss any questions you have with your health care provider. Document Released: 11/25/2005 Document Revised: 03/26/2016 Document Reviewed: 12/20/2013  2017 [employer]vier

## 2017-01-21 ENCOUNTER — Ambulatory Visit (INDEPENDENT_AMBULATORY_CARE_PROVIDER_SITE_OTHER): Payer: BLUE CROSS/BLUE SHIELD

## 2017-01-21 ENCOUNTER — Ambulatory Visit (INDEPENDENT_AMBULATORY_CARE_PROVIDER_SITE_OTHER): Payer: BLUE CROSS/BLUE SHIELD | Admitting: Podiatrist

## 2017-01-21 ENCOUNTER — Encounter: Payer: Self-pay | Admitting: Podiatrist

## 2017-01-21 DIAGNOSIS — M79672 Pain in left foot: Secondary | ICD-10-CM | POA: Diagnosis not present

## 2017-01-21 DIAGNOSIS — M19072 Primary osteoarthritis, left ankle and foot: Secondary | ICD-10-CM

## 2017-01-21 DIAGNOSIS — M25572 Pain in left ankle and joints of left foot: Secondary | ICD-10-CM

## 2017-01-21 NOTE — Patient Instructions (Signed)
Ankle Pain Many things can cause ankle pain, including an injury to the area and overuse of the ankle.The ankle joint holds your body weight and allows you to move around. Ankle pain can occur on either side or the back of one ankle or both ankles. Ankle pain may be sharp and burning or dull and aching. There may be tenderness, stiffness, redness, or warmth around the ankle. Follow these instructions at home: Activity  Rest your ankle as told by your health care provider. Avoid any activities that cause ankle pain.  Do exercises as told by your health care provider.  Ask your health care provider if you can drive. Using a brace, a bandage, or crutches  If you were given a brace: ? Wear it as told by your health care provider. ? Remove it when you take a bath or a shower. ? Try not to move your ankle very much, but wiggle your toes from time to time. This helps to prevent swelling.  If you were given an elastic bandage: ? Remove it when you take a bath or a shower. ? Try not to move your ankle very much, but wiggle your toes from time to time. This helps to prevent swelling. ? Adjust the bandage to make it more comfortable if it feels too tight. ? Loosen the bandage if you have numbness or tingling in your foot or if your foot turns cold and blue.  If you have crutches, use them as told by your health care provider. Continue to use them until you can walk without feeling pain in your ankle. Managing pain, stiffness, and swelling  Raise (elevate) your ankle above the level of your heart while you are sitting or lying down.  If directed, apply ice to the area: ? Put ice in a plastic bag. ? Place a towel between your skin and the bag. ? Leave the ice on for 20 minutes, 2-3 times per day. General instructions  Keep all follow-up visits as told by your health care provider. This is important.  Record this information that may be helpful for you and your health care provider: ? How  often you have ankle pain. ? Where the pain is located. ? What the pain feels like.  Take over-the-counter and prescription medicines only as told by your health care provider. Contact a health care provider if:  Your pain gets worse.  Your pain is not relieved with medicines.  You have a fever or chills.  You are having more trouble with walking.  You have new symptoms. Get help right away if:  Your foot, leg, toes, or ankle tingles or becomes numb.  Your foot, leg, toes, or ankle becomes swollen.  Your foot, leg, toes, or ankle turns pale or blue. This information is not intended to replace advice given to you by your health care provider. Make sure you discuss any questions you have with your health care provider. Document Released: 04/08/2010 Document Revised: 06/19/2016 Document Reviewed: 05/21/2015 Elsevier Interactive Patient Education  2017 Elsevier Inc.  

## 2017-01-21 NOTE — Progress Notes (Signed)
Patient is a 51 y/o male presenting for new onset left ankle pain and swelling for the last 4 days.  Relates no injury to the foot or ankle.  States he has been working a lot on the foot and relates the achilles tendon and the lateral leg feel tight to him due to swelling.  No trauma or injury noted. No systemic signs of infection reported  O:  Neurovascular status intact with pedal pulses palpable.  Swelling located along the anterior and medial ankle and the foot.  Pain along the ankle and especially the sinus tarsi region is noted with palpation.  No skin abnormalities or changes noted.  Xrays taken today reveal arthritis in the ankle especially along the medial malleolus to talus.  Posterior and inferior calcaneal spurs are present.  No acute boney abnormalities noted.  Swelling noted along the ankle as well most notably along the medial side  A:  Left ankle pain, sinus tarsi ankle pain, ankle swelling, arthritis  P:  Discussed treatment options.  Injected the sinus tarsi with 5mg  of kenalog and marcaine plain.  Recommended compression and elevation as well as an ankle brace.  Triloc ankle brace dispensed.  He will follow up for a recheck and will call if any questions arise.

## 2017-02-03 ENCOUNTER — Ambulatory Visit (INDEPENDENT_AMBULATORY_CARE_PROVIDER_SITE_OTHER): Payer: BLUE CROSS/BLUE SHIELD | Admitting: Podiatry

## 2017-02-03 DIAGNOSIS — M109 Gout, unspecified: Secondary | ICD-10-CM | POA: Diagnosis not present

## 2017-02-03 DIAGNOSIS — M79672 Pain in left foot: Secondary | ICD-10-CM | POA: Diagnosis not present

## 2017-02-03 DIAGNOSIS — M65972 Unspecified synovitis and tenosynovitis, left ankle and foot: Secondary | ICD-10-CM

## 2017-02-03 DIAGNOSIS — M659 Synovitis and tenosynovitis, unspecified: Secondary | ICD-10-CM | POA: Diagnosis not present

## 2017-02-03 DIAGNOSIS — M25572 Pain in left ankle and joints of left foot: Secondary | ICD-10-CM

## 2017-02-03 DIAGNOSIS — M7752 Other enthesopathy of left foot: Secondary | ICD-10-CM

## 2017-02-06 NOTE — Progress Notes (Signed)
   Subjective:  Patient presents today for ongoing pain and tenderness to the left ankle for the past two years. He states he is not getting any relief from wearing the brace and states he possible returned to work too soon. Patient relates significant pain and tenderness when walking. Patient presents for further treatment and evaluation.  Objective / Physical Exam:  General:  The patient is alert and oriented x3 in no acute distress. Dermatology:  Skin is warm, dry and supple bilateral lower extremities. Negative for open lesions or macerations. Vascular:  Palpable pedal pulses bilaterally. No edema or erythema noted. Capillary refill within normal limits. Neurological:  Epicritic and protective threshold grossly intact bilaterally.  Musculoskeletal Exam:  Pain on palpation to the anterior lateral medial aspects of the patient's left ankle. Mild edema noted.  Range of motion within normal limits to all pedal and ankle joints bilateral. Muscle strength 5/5 in all groups bilateral.   Assessment: #1 pain in left ankle #2 synovitis of left ankle #3 capsulitis of left ankle  Plan of Care:  #1 Patient was evaluated. #2 injection of 0.5 mL Celestone Soluspan injected in the patient's left ankle. #3 ordered Uric Acid level #4 ordered MRI #5 Cam boot provided #6 ankle brace dispensed #7 patient is to return to clinic in 4 weeks   Edrick Kins, DPM [facility]  Dr. Edrick Kins, [address]                                        [place], [place] [address]                Office [phone]  Fax [phone]

## 2017-02-08 ENCOUNTER — Telehealth: Payer: Self-pay | Admitting: *Deleted

## 2017-02-08 DIAGNOSIS — M7752 Other enthesopathy of left foot: Secondary | ICD-10-CM

## 2017-02-08 MED ORDER — BETAMETHASONE SOD PHOS & ACET 6 (3-3) MG/ML IJ SUSP: 3.0000 mg | Freq: Once | INTRAMUSCULAR | Status: DC

## 2017-02-08 NOTE — Telephone Encounter (Addendum)
-----   Message from Edrick Kins, DPM sent at 02/08/2017  2:06 PM EDT ----- Regarding: MRI left ankle Please order MRI left ankle w/ or w/out contrast. Dx : unresolving ankle pain x 2 years.   Note dictated. Thanks, Dr. Amalia Hailey. Faxed orders to [place] and gave to [place]. [place] for [employer].03/08/2017-FAxed appeals letter, clinicals and orders to [facility] of [place] Department Level 1.

## 2017-02-17 ENCOUNTER — Other Ambulatory Visit: Payer: Self-pay | Admitting: Podiatry

## 2017-02-17 ENCOUNTER — Ambulatory Visit: Payer: BLUE CROSS/BLUE SHIELD | Admitting: Podiatry

## 2017-02-17 ENCOUNTER — Telehealth: Payer: Self-pay | Admitting: *Deleted

## 2017-02-17 NOTE — Telephone Encounter (Signed)
"  John Kaiser is scheduled for a MRI on next Friday ist needs prior authorization."

## 2017-02-26 ENCOUNTER — Other Ambulatory Visit: Payer: Self-pay

## 2017-03-03 ENCOUNTER — Ambulatory Visit: Payer: BLUE CROSS/BLUE SHIELD | Admitting: Podiatry

## 2017-03-08 ENCOUNTER — Encounter: Payer: Self-pay | Admitting: *Deleted

## 2017-11-25 ENCOUNTER — Ambulatory Visit: Payer: BLUE CROSS/BLUE SHIELD | Admitting: Internal Medicine

## 2017-11-25 ENCOUNTER — Encounter: Payer: Self-pay | Admitting: Internal Medicine

## 2017-11-25 VITALS — BP 132/84 | HR 91 | Temp 98.3°F | Wt 242.0 lb

## 2017-11-25 DIAGNOSIS — Z0000 Encounter for general adult medical examination without abnormal findings: Secondary | ICD-10-CM

## 2017-11-25 DIAGNOSIS — E785 Hyperlipidemia, unspecified: Secondary | ICD-10-CM | POA: Diagnosis not present

## 2017-11-25 DIAGNOSIS — J309 Allergic rhinitis, unspecified: Secondary | ICD-10-CM | POA: Insufficient documentation

## 2017-11-25 DIAGNOSIS — R0683 Snoring: Secondary | ICD-10-CM | POA: Diagnosis not present

## 2017-11-25 DIAGNOSIS — R030 Elevated blood-pressure reading, without diagnosis of hypertension: Secondary | ICD-10-CM

## 2017-11-25 DIAGNOSIS — R42 Dizziness and giddiness: Secondary | ICD-10-CM | POA: Insufficient documentation

## 2017-11-25 MED ORDER — PITAVASTATIN CALCIUM 1 MG PO TABS: 1.0000 mg | ORAL_TABLET | Freq: Every day | ORAL | 11 refills | Status: DC

## 2017-11-25 NOTE — Assessment & Plan Note (Signed)
Pulm ref Dr Alva 

## 2017-11-25 NOTE — Assessment & Plan Note (Signed)
Meclizine Benign Positional Vertigo symptoms on the right. Start Meclizine. Start Laruth Bouchard - Daroff exercise several times a day as dirrected.

## 2017-11-25 NOTE — Assessment & Plan Note (Signed)
Try Livalo 

## 2017-11-25 NOTE — Assessment & Plan Note (Signed)
BP Readings from Last 3 Encounters:  11/25/17 132/84  10/28/16 (!) 170/84  09/22/16 (!) 158/97

## 2017-11-25 NOTE — Assessment & Plan Note (Signed)
- 
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
## 2017-12-09 ENCOUNTER — Telehealth: Payer: Self-pay

## 2017-12-09 NOTE — Telephone Encounter (Signed)
[facility] - Rx #: [id number]   Approved today from 12/09/2017 through 12/07/2020.

## 2017-12-27 ENCOUNTER — Telehealth: Payer: Self-pay | Admitting: Internal Medicine

## 2017-12-27 NOTE — Telephone Encounter (Signed)
Spoke with patient. Informed him he could get them labs before or the day of or after. The orders are already in. Patient understood.

## 2017-12-27 NOTE — Telephone Encounter (Signed)
Copied from [place] [id number]. Topic: Quick Communication - See Telephone Encounter >> Dec 27, 2017 12:19 PM Cleaster Corin, NT wrote: CRM for notification. See Telephone encounter for:   12/27/17. Pt. Calling wanting to know if he needs to have lab work done before or the day of appt. 01-07-2018 at 9am with Dr. Alain Marion.

## 2018-01-06 ENCOUNTER — Other Ambulatory Visit (INDEPENDENT_AMBULATORY_CARE_PROVIDER_SITE_OTHER): Payer: BLUE CROSS/BLUE SHIELD

## 2018-01-06 ENCOUNTER — Ambulatory Visit: Payer: BLUE CROSS/BLUE SHIELD | Admitting: Pulmonary Disease

## 2018-01-06 ENCOUNTER — Encounter: Payer: Self-pay | Admitting: Pulmonary Disease

## 2018-01-06 DIAGNOSIS — E785 Hyperlipidemia, unspecified: Secondary | ICD-10-CM

## 2018-01-06 DIAGNOSIS — G4733 Obstructive sleep apnea (adult) (pediatric): Secondary | ICD-10-CM | POA: Diagnosis not present

## 2018-01-06 DIAGNOSIS — R42 Dizziness and giddiness: Secondary | ICD-10-CM | POA: Diagnosis not present

## 2018-01-06 DIAGNOSIS — Z0000 Encounter for general adult medical examination without abnormal findings: Secondary | ICD-10-CM

## 2018-01-06 LAB — BASIC METABOLIC PANEL
BUN: 17 mg/dL (ref 6–23)
CALCIUM: 9.8 mg/dL (ref 8.4–10.5)
CO2: 26 mEq/L (ref 19–32)
Chloride: 104 mEq/L (ref 96–112)
Creatinine, Ser: 1.11 mg/dL (ref 0.40–1.50)
GFR: 73.56 mL/min (ref >60.00)
GLUCOSE: 128 mg/dL — AB (ref 70–99)
POTASSIUM: 4.3 meq/L (ref 3.5–5.1)
SODIUM: 139 meq/L (ref 135–145)

## 2018-01-06 LAB — CBC WITH DIFFERENTIAL/PLATELET
BASOS ABS: 0.1 10*3/uL (ref 0.0–0.1)
BASOS PCT: 0.6 % (ref 0.0–3.0)
Eosinophils Absolute: 0.3 10*3/uL (ref 0.0–0.7)
Eosinophils Relative: 3.1 % (ref 0.0–5.0)
HEMATOCRIT: 44.4 % (ref 39.0–52.0)
Hemoglobin: 15.4 g/dL (ref 13.0–17.0)
LYMPHS ABS: 3 10*3/uL (ref 0.7–4.0)
LYMPHS PCT: 36.9 % (ref 12.0–46.0)
MCHC: 34.6 g/dL (ref 30.0–36.0)
MCV: 90.5 fl (ref 78.0–100.0)
MONOS PCT: 7.3 % (ref 3.0–12.0)
Monocytes Absolute: 0.6 10*3/uL (ref 0.1–1.0)
NEUTROS ABS: 4.3 10*3/uL (ref 1.4–7.7)
NEUTROS PCT: 52.1 % (ref 43.0–77.0)
PLATELETS: 222 10*3/uL (ref 150.0–400.0)
RBC: 4.9 Mil/uL (ref 4.22–5.81)
RDW: 13.1 % (ref 11.5–15.5)
WBC: 8.2 10*3/uL (ref 4.0–10.5)

## 2018-01-06 LAB — HEPATIC FUNCTION PANEL
ALT: 23 U/L (ref 0–53)
AST: 21 U/L (ref 0–37)
Albumin: 4.4 g/dL (ref 3.5–5.2)
Alkaline Phosphatase: 37 U/L — ABNORMAL LOW (ref 39–117)
BILIRUBIN TOTAL: 0.7 mg/dL (ref 0.2–1.2)
Bilirubin, Direct: 0.2 mg/dL (ref 0.0–0.3)
Total Protein: 7.2 g/dL (ref 6.0–8.3)

## 2018-01-06 LAB — LIPID PANEL
Cholesterol: 224 mg/dL — ABNORMAL HIGH (ref 0–200)
HDL: 36.2 mg/dL — AB (ref >39.00)
NonHDL: 187.91
Total CHOL/HDL Ratio: 6
Triglycerides: 221 mg/dL — ABNORMAL HIGH (ref 0.0–149.0)
VLDL: 44.2 mg/dL — ABNORMAL HIGH (ref 0.0–40.0)

## 2018-01-06 LAB — PSA: PSA: 1.85 ng/mL (ref 0.10–4.00)

## 2018-01-06 LAB — LDL CHOLESTEROL, DIRECT: Direct LDL: 141 mg/dL

## 2018-01-06 LAB — TSH: TSH: 2.02 u[IU]/mL (ref 0.35–4.50)

## 2018-01-06 NOTE — Patient Instructions (Addendum)
Schedule CPAP titration study. Based on this we will set up with a CPAP machine and appropriate mask. We discussed other treatment options

## 2018-01-06 NOTE — Assessment & Plan Note (Signed)
Schedule CPAP titration study. Based on this we will set up with a CPAP machine and appropriate mask. We discussed other treatment options including INSPIRE device  Weight loss encouraged, compliance with goal of at least 4-6 hrs every night is the expectation. Advised against medications with sedative side effects Cautioned against driving when sleepy - understanding that sleepiness will vary on a day to day basis

## 2018-01-06 NOTE — Assessment & Plan Note (Signed)
Consider flonase seasonally

## 2018-01-06 NOTE — Progress Notes (Signed)
Subjective:    Patient ID: John Kaiser, male    DOB: 06/30/1966, 52 y.o.   MRN: [id number]  HPI  52 year old man, sound Art gallery manager for [employer] presents for evaluation of obstructive sleep apnea. He was evaluated in 2016 and found to have severe OSA with AHI 62/hour, he was placed on CPAP machine and tried different types of full facemask but was very claustrophobic and could not tolerated all.  Finally returned machine. He continued to remain very sleepy and presents for evaluation. Epworth sleepiness score is 16 and he reports sleepiness while sitting and reading, watching TV, as a passenger in a car, lying down to rest in the afternoons. Bedtime is between 11 PM and midnight, sleep latency can be up to 30 minutes, he sleeps on his side with one pillow, reports numerous nocturnal awakenings, sometimes wakes up in a panic and has to sleep on the side of the bed.  He also has severe heartburn and occasionally this will wake him up at night, he takes over-the-counter Zantac for this. Out of bed around 7 AM feeling sluggish with severe dryness of mouth.  He keeps a bottle of water by his bedside. There are times that he feels that he can sleep all day long.  He will take a nap whenever possible. His weight has remained stable between 2 3060-40 pounds There is no history suggestive of cataplexy, sleep paralysis or parasomnias   Significant tests/ events reviewed  HST 07/2015 severe OSA with AHI 62/hour with lowest desaturation 79%   Past Medical History:  Diagnosis Date  . High blood pressure   . High cholesterol    Past Surgical History:  Procedure Laterality Date  . BACK SURGERY  1998    Allergies  Allergen Reactions  . Asa [Aspirin] Hives  . Lipitor [Atorvastatin]     ?  Marland Kitchen Pravachol [Pravastatin Sodium]     Achy     Social History   Socioeconomic History  . Marital status: Single    Spouse name: Not on file  . Number of children: Not on file  . Years of  education: Not on file  . Highest education level: Not on file  Social Needs  . Financial resource strain: Not on file  . Food insecurity - worry: Not on file  . Food insecurity - inability: Not on file  . Transportation needs - medical: Not on file  . Transportation needs - non-medical: Not on file  Occupational History  . Not on file  Tobacco Use  . Smoking status: Never Smoker  . Smokeless tobacco: Never Used  Substance and Sexual Activity  . Alcohol use: Yes    Alcohol/week: 0.0 oz    Comment: social  . Drug use: No    Comment: smoked marijuana in high school  . Sexual activity: Not on file  Other Topics Concern  . Not on file  Social History Narrative  . Not on file    Family History  Problem Relation Age of Onset  . Arthritis Mother   . Depression Mother   . Heart disease Father 14       MI  . Hypertension Father   . Hyperlipidemia Father   . Diabetes Sister   . Diabetes Paternal Grandmother      Review of Systems  Positive for acid heartburn, nasal congestion and depression  Constitutional: negative for anorexia, fevers and sweats  Eyes: negative for irritation, redness and visual disturbance  Ears, nose, mouth, throat, and  face: negative for earaches, epistaxis and sore throat  Respiratory: negative for cough, dyspnea on exertion, sputum and wheezing  Cardiovascular: negative for chest pain, dyspnea, lower extremity edema, orthopnea, palpitations and syncope  Gastrointestinal: negative for abdominal pain, constipation, diarrhea, melena, nausea and vomiting  Genitourinary:negative for dysuria, frequency and hematuria  Hematologic/lymphatic: negative for bleeding, easy bruising and lymphadenopathy  Musculoskeletal:negative for arthralgias, muscle weakness and stiff joints  Neurological: negative for coordination problems, gait problems, headaches and weakness  Endocrine: negative for diabetic symptoms including polydipsia, polyuria and weight loss       Objective:   Physical Exam  Gen. Pleasant, well-nourished, in no distress, normal affect ENT - class 2 aiway, no post nasal drip Neck: No JVD, no thyromegaly, no carotid bruits Lungs: no use of accessory muscles, no dullness to percussion, clear without rales or rhonchi  Cardiovascular: Rhythm regular, heart sounds  normal, no murmurs or gallops, no peripheral edema Abdomen: soft and non-tender, no hepatosplenomegaly, BS normal. Musculoskeletal: No deformities, no cyanosis or clubbing Neuro:  alert, non focal        Assessment & Plan:

## 2018-01-07 ENCOUNTER — Encounter: Payer: Self-pay | Admitting: Internal Medicine

## 2018-01-07 ENCOUNTER — Ambulatory Visit: Payer: BLUE CROSS/BLUE SHIELD | Admitting: Internal Medicine

## 2018-01-07 VITALS — BP 136/84 | HR 54 | Temp 97.9°F | Ht 72.0 in | Wt 253.0 lb

## 2018-01-07 DIAGNOSIS — E785 Hyperlipidemia, unspecified: Secondary | ICD-10-CM | POA: Diagnosis not present

## 2018-01-07 DIAGNOSIS — J309 Allergic rhinitis, unspecified: Secondary | ICD-10-CM | POA: Diagnosis not present

## 2018-01-07 DIAGNOSIS — R7309 Other abnormal glucose: Secondary | ICD-10-CM

## 2018-01-07 DIAGNOSIS — R42 Dizziness and giddiness: Secondary | ICD-10-CM

## 2018-01-07 MED ORDER — PITAVASTATIN CALCIUM 2 MG PO TABS: 1.0000 | ORAL_TABLET | Freq: Every day | ORAL | 11 refills | Status: DC
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
## 2018-01-07 NOTE — Assessment & Plan Note (Signed)
Increase Livalo to 2 mg/d

## 2018-01-07 NOTE — Patient Instructions (Signed)
Mediterranean Diet A Mediterranean diet refers to food and lifestyle choices that are based on the traditions of countries located on the [place]. This way of eating has been shown to help prevent certain conditions and improve outcomes for people who have chronic diseases, like kidney disease and heart disease. What are tips for following this plan? Lifestyle  Cook and eat meals together with your family, when possible.  Drink enough fluid to keep your urine clear or pale yellow.  Be physically active every day. This includes: ? Aerobic exercise like running or swimming. ? Leisure activities like gardening, walking, or housework.  Get 7-8 hours of sleep each night.  If recommended by your health care provider, drink red wine in moderation. This means 1 glass a day for nonpregnant women and 2 glasses a day for men. A glass of wine equals 5 oz (150 mL). Reading food labels  Check the serving size of packaged foods. For foods such as rice and pasta, the serving size refers to the amount of cooked product, not dry.  Check the total fat in packaged foods. Avoid foods that have saturated fat or trans fats.  Check the ingredients list for added sugars, such as corn syrup. Shopping  At the grocery store, buy most of your food from the areas near the walls of the store. This includes: ? Fresh fruits and vegetables (produce). ? Grains, beans, nuts, and seeds. Some of these may be available in unpackaged forms or large amounts (in bulk). ? Fresh seafood. ? Poultry and eggs. ? Low-fat dairy products.  Buy whole ingredients instead of prepackaged foods.  Buy fresh fruits and vegetables in-season from local farmers markets.  Buy frozen fruits and vegetables in resealable bags.  If you do not have access to quality fresh seafood, buy precooked frozen shrimp or canned fish, such as tuna, salmon, or sardines.  Buy small amounts of raw or cooked vegetables, salads, or olives from the  deli or salad bar at your store.  Stock your pantry so you always have certain foods on hand, such as olive oil, canned tuna, canned tomatoes, rice, pasta, and beans. Cooking  Cook foods with extra-virgin olive oil instead of using butter or other vegetable oils.  Have meat as a side dish, and have vegetables or grains as your main dish. This means having meat in small portions or adding small amounts of meat to foods like pasta or stew.  Use beans or vegetables instead of meat in common dishes like chili or lasagna.  Experiment with different cooking methods. Try roasting or broiling vegetables instead of steaming or sauteing them.  Add frozen vegetables to soups, stews, pasta, or rice.  Add nuts or seeds for added healthy fat at each meal. You can add these to yogurt, salads, or vegetable dishes.  Marinate fish or vegetables using olive oil, lemon juice, garlic, and fresh herbs. Meal planning  Plan to eat 1 vegetarian meal one day each week. Try to work up to 2 vegetarian meals, if possible.  Eat seafood 2 or more times a week.  Have healthy snacks readily available, such as: ? Vegetable sticks with hummus. ? Greek yogurt. ? Fruit and nut trail mix.  Eat balanced meals throughout the week. This includes: ? Fruit: 2-3 servings a day ? Vegetables: 4-5 servings a day ? Low-fat dairy: 2 servings a day ? Fish, poultry, or lean meat: 1 serving a day ? Beans and legumes: 2 or more servings a week ? Nuts   and seeds: 1-2 servings a day ? Whole grains: 6-8 servings a day ? Extra-virgin olive oil: 3-4 servings a day  Limit red meat and sweets to only a few servings a month What are my food choices?  Mediterranean diet ? Recommended ? Grains: Whole-grain pasta. Brown rice. Bulgar wheat. Polenta. Couscous. Whole-wheat bread. Oatmeal. Quinoa. ? Vegetables: Artichokes. Beets. Broccoli. Cabbage. Carrots. Eggplant. Green beans. Chard. Kale. Spinach. Onions. Leeks. Peas. Squash.  Tomatoes. Peppers. Radishes. ? Fruits: Apples. Apricots. Avocado. Berries. Bananas. Cherries. Dates. Figs. Grapes. Lemons. Melon. Oranges. Peaches. Plums. Pomegranate. ? Meats and other protein foods: Beans. Almonds. Sunflower seeds. Pine nuts. Peanuts. Cod. Salmon. Scallops. Shrimp. Tuna. Tilapia. Clams. Oysters. Eggs. ? Dairy: Low-fat milk. Cheese. Greek yogurt. ? Beverages: Water. Red wine. Herbal tea. ? Fats and oils: Extra virgin olive oil. Avocado oil. Grape seed oil. ? Sweets and desserts: Greek yogurt with honey. Baked apples. Poached pears. Trail mix. ? Seasoning and other foods: Basil. Cilantro. Coriander. Cumin. Mint. Parsley. Sage. Rosemary. Tarragon. Garlic. Oregano. Thyme. Pepper. Balsalmic vinegar. Tahini. Hummus. Tomato sauce. Olives. Mushrooms. ? Limit these ? Grains: Prepackaged pasta or rice dishes. Prepackaged cereal with added sugar. ? Vegetables: Deep fried potatoes (french fries). ? Fruits: Fruit canned in syrup. ? Meats and other protein foods: Beef. Pork. Lamb. Poultry with skin. Hot dogs. Bacon. ? Dairy: Ice cream. Sour cream. Whole milk. ? Beverages: Juice. Sugar-sweetened soft drinks. Beer. Liquor and spirits. ? Fats and oils: Butter. Canola oil. Vegetable oil. Beef fat (tallow). Lard. ? Sweets and desserts: Cookies. Cakes. Pies. Candy. ? Seasoning and other foods: Mayonnaise. Premade sauces and marinades. ? The items listed may not be a complete list. Talk with your dietitian about what dietary choices are right for you. Summary  The Mediterranean diet includes both food and lifestyle choices.  Eat a variety of fresh fruits and vegetables, beans, nuts, seeds, and whole grains.  Limit the amount of red meat and sweets that you eat.  Talk with your health care provider about whether it is safe for you to drink red wine in moderation. This means 1 glass a day for nonpregnant women and 2 glasses a day for men. A glass of wine equals 5 oz (150 mL). This information  is not intended to replace advice given to you by your health care provider. Make sure you discuss any questions you have with your health care provider. Document Released: 06/11/2016 Document Revised: 07/14/2016 Document Reviewed: 06/11/2016 Elsevier Interactive Patient Education  2018 Elsevier Inc.  

## 2018-01-07 NOTE — Assessment & Plan Note (Signed)
- 

## 2018-01-07 NOTE — Assessment & Plan Note (Signed)
Loose wt Labs 

## 2018-01-07 NOTE — Assessment & Plan Note (Signed)
Much better 

## 2018-01-24 ENCOUNTER — Telehealth: Payer: Self-pay | Admitting: Pulmonary Disease

## 2018-01-24 ENCOUNTER — Ambulatory Visit (HOSPITAL_BASED_OUTPATIENT_CLINIC_OR_DEPARTMENT_OTHER): Payer: BLUE CROSS/BLUE SHIELD | Attending: Pulmonary Disease | Admitting: Pulmonary Disease

## 2018-01-24 VITALS — Ht 71.0 in | Wt 242.0 lb

## 2018-01-24 DIAGNOSIS — G4733 Obstructive sleep apnea (adult) (pediatric): Secondary | ICD-10-CM | POA: Diagnosis present

## 2018-01-24 DIAGNOSIS — G4731 Primary central sleep apnea: Secondary | ICD-10-CM | POA: Diagnosis not present

## 2018-01-24 NOTE — Telephone Encounter (Signed)
Per Dr. Elsworth Soho - pt is to have a CPAP titration study.  Spoke with [employer]. He is aware of Dr. Bari Mantis response. Nothing further was needed.

## 2018-01-24 NOTE — Telephone Encounter (Signed)
Spoke with Lynnae Sandhoff at [facility]. States that the pt is scheduled for a CPAP titration study tonight. The pt has not used CPAP since 2016. Lynnae Sandhoff is questioning if he needs a split night study instead. Cherina has contacted Dr. Elsworth Soho about this matter. Will await Dr. Bari Mantis response.

## 2018-01-31 ENCOUNTER — Telehealth: Payer: Self-pay | Admitting: Pulmonary Disease

## 2018-01-31 DIAGNOSIS — G4730 Sleep apnea, unspecified: Secondary | ICD-10-CM | POA: Diagnosis not present

## 2018-01-31 DIAGNOSIS — G4733 Obstructive sleep apnea (adult) (pediatric): Secondary | ICD-10-CM

## 2018-01-31 NOTE — Telephone Encounter (Signed)
autoCPAP therapy on 8-20 cm H2O with a Medium size Philips Respironics Full Face Mask Dreamwear mask and heated humidification.  OV with ME in 6 wks

## 2018-01-31 NOTE — Procedures (Signed)
Patient Name: John Kaiser, John Kaiser Date: 01/24/2018 Gender: Male D.O.B: 03-19-1966 Age (years): 28 Referring Provider: Kara Mead MD, ABSM Height (inches): 71 Interpreting Physician: Kara Mead MD, ABSM Weight (lbs): 242 RPSGT: Laren Everts BMI: 34 MRN: [id number] Neck Size: 18.50 <br> <br> CLINICAL INFORMATION The patient is referred for a CPAP titration to treat sleep apnea.  Date of  HST:07/2015 severe OSA with AHI 62/hour with lowest desaturation 79%  SLEEP STUDY TECHNIQUE As per the AASM Manual for the Scoring of Sleep and Associated Events v2.3 (April 2016) with a hypopnea requiring 4% desaturations.  The channels recorded and monitored were frontal, central and occipital EEG, electrooculogram (EOG), submentalis EMG (chin), nasal and oral airflow, thoracic and abdominal wall motion, anterior tibialis EMG, snore microphone, electrocardiogram, and pulse oximetry. Continuous positive airway pressure (CPAP) was initiated at the beginning of the study and titrated to treat sleep-disordered breathing.  MEDICATIONS Medications self-administered by patient taken the night of the study : LIVALO, [place], [place], ASPIRIN  TECHNICIAN COMMENTS Comments added by technician: Patient had difficulty initiating sleep. Patient was restless all through the night. Comments added by scorer: N/A RESPIRATORY PARAMETERS Optimal PAP Pressure (cm): 19 AHI at Optimal Pressure (/hr): 20.8 Overall Minimal O2 (%): 84.0 Supine % at Optimal Pressure (%): 0 Minimal O2 at Optimal Pressure (%): 90.0   SLEEP ARCHITECTURE The study was initiated at 10:45:23 PM and ended at 5:07:23 AM.  Sleep onset time was 48.7 minutes and the sleep efficiency was 58.6%%. The total sleep time was 224.0 minutes.  The patient spent 19.2%% of the night in stage N1 sleep, 54.5%% in stage N2 sleep, 0.0%% in stage N3 and 26.34% in REM.Stage REM latency was 121.0 minutes  Wake after sleep onset was 109.3. Alpha intrusion was  absent. Supine sleep was 7.13%.  CARDIAC DATA The 2 lead EKG demonstrated sinus rhythm. The mean heart rate was 64.4 beats per minute. Other EKG findings include: None.   LEG MOVEMENT DATA The total Periodic Limb Movements of Sleep (PLMS) were 0. The PLMS index was 0.0. A PLMS index of <15 is considered normal in adults.  IMPRESSIONS - The optimal PAP pressure was 19 cm of water. - Moderate Central Sleep Apnea was noted during this titration (CAI = 20.1/h).centrals emerged at higher levels of CPAP - Moderate oxygen desaturations were observed during this titration (min O2 = 84.0%). - No snoring was audible during this study. - No cardiac abnormalities were observed during this study. - Clinically significant periodic limb movements were not noted during this study. Arousals associated with PLMs were rare.   DIAGNOSIS - Obstructive Sleep Apnea (327.23 [G47.33 ICD-10]) - Treatment emergent central sleep apnea   RECOMMENDATIONS - Trial of autoCPAP therapy on 8-20 cm H2O with a Medium size Philips Respironics Full Face Mask Dreamwear mask and heated humidification. - If centrals persist on downloads, consider ASV therapy - Avoid alcohol, sedatives and other CNS depressants that may worsen sleep apnea and disrupt normal sleep architecture. - Sleep hygiene should be reviewed to assess factors that may improve sleep quality. - Weight management and regular exercise should be initiated or continued. - Return to [facility] for re-evaluation after 4 weeks of therapy   Kara Mead MD Board Certified in [place]

## 2018-02-01 NOTE — Telephone Encounter (Signed)
Spoke with patient. He is aware of RA's recs. He wishes to proceed with the cpap machine. Advised patient that I would place the order today, he verbalized understanding.   Follow up appt has been scheduled for 03/22/18 at 1015am. He is aware of appt.   Nothing else needed at time of call.

## 2018-02-03 ENCOUNTER — Telehealth: Payer: Self-pay | Admitting: Pulmonary Disease

## 2018-02-03 NOTE — Telephone Encounter (Signed)
lmtcb x1 for pt. 

## 2018-02-04 NOTE — Telephone Encounter (Signed)
lmtcb x1 for pt. 

## 2018-02-04 NOTE — Telephone Encounter (Signed)
Patient returned call, CB is [phone]

## 2018-02-04 NOTE — Telephone Encounter (Signed)
LVM for patient to return call regarding change of DME companies. X2

## 2018-02-07 NOTE — Telephone Encounter (Signed)
Order has been sent to [facility] per patients request

## 2018-02-07 NOTE — Telephone Encounter (Signed)
Called and spoke to the patient. Patient would like to use [facility] for DME on [address].   Routing to PCCs.

## 2018-03-22 ENCOUNTER — Encounter: Payer: Self-pay | Admitting: Pulmonary Disease

## 2018-03-22 ENCOUNTER — Ambulatory Visit: Payer: BLUE CROSS/BLUE SHIELD | Admitting: Pulmonary Disease

## 2018-03-22 DIAGNOSIS — G4733 Obstructive sleep apnea (adult) (pediatric): Secondary | ICD-10-CM

## 2018-03-22 NOTE — Progress Notes (Signed)
Subjective:    Patient ID: John Kaiser, male    DOB: [birth year], 52 y.o.   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 He was found to have severe OSA in 2016 with AHI 62/hour,  but was very claustrophobic and could not tolerate CPAP at all.  Based on titration study,12/2017, we sent in a prescription for autoCPAP therapy on 8-20 cm H2O with a Medium size Philips Respironics Full Face Mask Dreamwear mask  There was a delay and he is only obtain his machine a few days ago, he is tried last 2 nights and was only able to use it 2 to 3 hours.  He was given air fit F 30 mask by [facility] his pressure is okay but he has to get used to the mask.  Also has pressures at work.  He is headed to the beach next week and hopes to be able to relax and use The machine  Download shows average pressure of 12 cm and good control of events but usage is only 2 hours last 2 nights  Significant tests/ events reviewed  HST 07/2015 severe OSA with AHI 62/hour with lowest desaturation 79% 12/2017 CPAP titration >> centrals emerge >> 8-20 cm  Review of Systems Patient denies significant dyspnea,cough, hemoptysis,  chest pain, palpitations, pedal edema, orthopnea, paroxysmal nocturnal dyspnea, lightheadedness, nausea, vomiting, abdominal or  leg pains      Objective:   Physical Exam  Gen. Pleasant, obese, in no distress ENT - class 2 airway, no post nasal drip Neck: No JVD, no thyromegaly, no carotid bruits Lungs: no use of accessory muscles, no dullness to percussion, decreased without rales or rhonchi  Cardiovascular: Rhythm regular, heart sounds  normal, no murmurs or gallops, no peripheral edema Musculoskeletal: No deformities, no cyanosis or clubbing , no tremors       Assessment & Plan:

## 2018-03-22 NOTE — Assessment & Plan Note (Signed)
He had treatment emergent centrals during his titration study but download from last night did not show any evidence of centrals of a significant residuals on hopeful that he will not require another machine.  Also average pressure was only 12 cm. Compliance was again improved encouraged and he will try to use the CPAP more diligently next week when he is on vacation  Weight loss encouraged, compliance with goal of at least 4-6 hrs every night is the expectation. Advised against medications with sedative side effects Cautioned against driving when sleepy - understanding that sleepiness will vary on a day to day basis

## 2018-03-22 NOTE — Patient Instructions (Signed)
Try to use her machine at least 4 hours every night  call me to report in 1 month

## 2018-05-11 ENCOUNTER — Ambulatory Visit: Payer: BLUE CROSS/BLUE SHIELD | Admitting: Internal Medicine

## 2018-05-25 ENCOUNTER — Ambulatory Visit: Payer: BLUE CROSS/BLUE SHIELD | Admitting: Internal Medicine

## 2018-05-30 ENCOUNTER — Encounter: Payer: Self-pay | Admitting: Internal Medicine

## 2018-05-30 ENCOUNTER — Ambulatory Visit: Payer: BLUE CROSS/BLUE SHIELD | Admitting: Internal Medicine

## 2018-05-30 DIAGNOSIS — R7309 Other abnormal glucose: Secondary | ICD-10-CM | POA: Diagnosis not present

## 2018-05-30 DIAGNOSIS — J309 Allergic rhinitis, unspecified: Secondary | ICD-10-CM | POA: Diagnosis not present

## 2018-05-30 DIAGNOSIS — C4359 Malignant melanoma of other part of trunk: Secondary | ICD-10-CM | POA: Diagnosis not present

## 2018-05-30 DIAGNOSIS — E785 Hyperlipidemia, unspecified: Secondary | ICD-10-CM

## 2018-05-30 DIAGNOSIS — R42 Dizziness and giddiness: Secondary | ICD-10-CM

## 2018-05-30 DIAGNOSIS — Z8249 Family history of ischemic heart disease and other diseases of the circulatory system: Secondary | ICD-10-CM | POA: Diagnosis not present

## 2018-05-30 NOTE — Assessment & Plan Note (Addendum)
Labs, wt loss

## 2018-05-30 NOTE — Assessment & Plan Note (Signed)
Resolved after 2-3 months

## 2018-05-30 NOTE — Assessment & Plan Note (Signed)
Skin check q 12 mo

## 2018-05-30 NOTE — Assessment & Plan Note (Signed)
[place] 

## 2018-05-30 NOTE — Assessment & Plan Note (Signed)
- 
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
## 2018-05-30 NOTE — Assessment & Plan Note (Addendum)
Treating dyslipidemia CT cardiac scoring or Card ref offered

## 2018-11-30 ENCOUNTER — Encounter: Payer: BLUE CROSS/BLUE SHIELD | Admitting: Internal Medicine

## 2020-12-03 ENCOUNTER — Other Ambulatory Visit: Payer: Self-pay

## 2020-12-03 ENCOUNTER — Ambulatory Visit: Payer: 59 | Admitting: Podiatry

## 2020-12-03 ENCOUNTER — Other Ambulatory Visit: Payer: Self-pay | Admitting: Podiatry

## 2020-12-03 ENCOUNTER — Ambulatory Visit (INDEPENDENT_AMBULATORY_CARE_PROVIDER_SITE_OTHER): Payer: 59

## 2020-12-03 DIAGNOSIS — M7752 Other enthesopathy of left foot: Secondary | ICD-10-CM | POA: Diagnosis not present

## 2020-12-03 DIAGNOSIS — M1000 Idiopathic gout, unspecified site: Secondary | ICD-10-CM | POA: Diagnosis not present

## 2020-12-03 DIAGNOSIS — M7750 Other enthesopathy of unspecified foot: Secondary | ICD-10-CM | POA: Diagnosis not present

## 2020-12-03 DIAGNOSIS — M779 Enthesopathy, unspecified: Secondary | ICD-10-CM

## 2020-12-03 MED ORDER — METHYLPREDNISOLONE 4 MG PO TBPK: ORAL_TABLET | ORAL | 0 refills | Status: DC

## 2020-12-03 MED ORDER — TRIAMCINOLONE ACETONIDE 10 MG/ML IJ SUSP: 10.0000 mg | Freq: Once | INTRAMUSCULAR | Status: DC

## 2020-12-04 LAB — BASIC METABOLIC PANEL
BUN/Creatinine Ratio: 14 (ref 9–20)
BUN: 16 mg/dL (ref 6–24)
CO2: 22 mmol/L (ref 20–29)
Calcium: 10.2 mg/dL (ref 8.7–10.2)
Chloride: 98 mmol/L (ref 96–106)
Creatinine, Ser: 1.16 mg/dL (ref 0.76–1.27)
GFR calc Af Amer: 81 mL/min/{1.73_m2} (ref >59)
GFR calc non Af Amer: 70 mL/min/{1.73_m2} (ref >59)
Glucose: 186 mg/dL — ABNORMAL HIGH (ref 65–99)
Potassium: 4.4 mmol/L (ref 3.5–5.2)
Sodium: 135 mmol/L (ref 134–144)

## 2020-12-04 LAB — URIC ACID: Uric Acid: 6.6 mg/dL (ref 3.8–8.4)

## 2020-12-09 NOTE — Progress Notes (Signed)
Subjective:   Patient ID: John Kaiser, male   DOB: 55 y.o.   MRN: [id number]   HPI 55 year old male presents the office for concerns of left ankle pain.  He states that the pain started on Saturday when he woke up and the pain has been intense.  He denies any recent injury or trauma.  He has been wearing a cam boot for the last few days that he already had.  He can take meloxicam as needed.  He said no recent treatment otherwise.  He does have a history of gout and had injections previously.   Review of Systems  All other systems reviewed and are negative.  Past Medical History:  Diagnosis Date  . High blood pressure   . High cholesterol     Past Surgical History:  Procedure Laterality Date  . BACK SURGERY  1998     Current Outpatient Medications:  .  methylPREDNISolone (MEDROL DOSEPAK) 4 MG TBPK tablet, Take as directed, Disp: 21 tablet, Rfl: 0 .  aspirin 81 MG chewable tablet, , Disp: , Rfl:  .  Cholecalciferol (VITAMIN D3) 2000 UNITS capsule, Take 1 capsule (2,000 Units total) by mouth daily., Disp: 100 capsule, Rfl: 3 .  ibuprofen (ADVIL,MOTRIN) 200 MG tablet, Take 200 mg by mouth every 6 (six) hours as needed., Disp: , Rfl:  .  Multiple Vitamin (MULTI-VITAMIN) tablet, Take 1 tablet by mouth daily., Disp: , Rfl:  .  Pitavastatin Calcium 2 MG TABS, Take 1 tablet (2 mg total) by mouth daily., Disp: 30 tablet, Rfl: 11  Current Facility-Administered Medications:  .  triamcinolone acetonide (KENALOG) 10 MG/ML injection 10 mg, 10 mg, Other, Once, Trula Slade, DPM  Allergies  Allergen Reactions  . Asa [Aspirin] Hives  . Lipitor [Atorvastatin]     ?  Marland Kitchen Pravachol [Pravastatin Sodium]     Achy          Objective:  Physical Exam  General: AAO x3, NAD  Dermatological: Skin is warm, dry and supple bilateral. There are no open sores, no preulcerative lesions, no rash or signs of infection present.  Vascular: Dorsalis Pedis artery and Posterior Tibial artery pedal  pulses are 2/4 bilateral with immedate capillary fill time. There is no pain with calf compression, swelling, warmth, erythema.   Neruologic: Grossly intact via light touch bilateral.   Musculoskeletal: There is tenderness palpation of the left anterior ankle joint line as well as the anteromedial, anterolateral ankle.  Mild edema present to the ankle.  There is no erythema or warmth.  He is able to move the ankle.  There is no open lesions there is no fluctuation crepitation.  Muscular strength 5/5 in all groups tested bilateral.  Gait: Unassisted, Nonantalgic.       Assessment:   55 year old male left ankle capsulitis, likely gout     Plan:  -Treatment options discussed including all alternatives, risks, and complications -Etiology of symptoms were discussed -X-rays were obtained and reviewed with the patient.  There is no evidence of acute fracture, stress fracture, osteomyelitis or soft tissue emphysema. -Steroid injection performed.  See procedure note below. -Medrol dose pack -Remain in CAM Boot -Elevation -Hydration  -Blood work to check uric acid as well as complete metabolic panel  Procedure: Injection intermediate joint Discussed alternatives, risks, complications and verbal consent was obtained.  Location: Left anterior medial ankle Skin Prep: Betadine Injectate: 0.5cc 0.5% marcaine plain, 0.5 cc 2% lidocaine plain and, 1 cc kenalog 10. Disposition: Patient tolerated procedure well. Injection  site dressed with a band-aid.  Post-injection care was discussed and return precautions discussed.   Trula Slade DPM

## 2020-12-10 ENCOUNTER — Ambulatory Visit (INDEPENDENT_AMBULATORY_CARE_PROVIDER_SITE_OTHER): Payer: 59 | Admitting: Podiatry

## 2020-12-10 ENCOUNTER — Other Ambulatory Visit: Payer: Self-pay

## 2020-12-10 DIAGNOSIS — M1000 Idiopathic gout, unspecified site: Secondary | ICD-10-CM | POA: Diagnosis not present

## 2020-12-10 DIAGNOSIS — M779 Enthesopathy, unspecified: Secondary | ICD-10-CM | POA: Diagnosis not present

## 2020-12-10 NOTE — Progress Notes (Signed)
Subjective: 55 year old male presents the office today for follow-up evaluation of left ankle pain, capsulitis and likely gout. Overall he is doing better. Is able to walk without the boot. Only minimal soreness. He is scheduled finished the course of the steroids tomorrow. Overall doing much better and has no other concerns today. Denies any systemic complaints such as fevers, chills, nausea, vomiting. No acute changes since last appointment, and no other complaints at this time.   Objective: AAO x3, NAD DP/PT pulses palpable bilaterally, CRT less than 3 seconds There is no significant tenderness palpation of the ankle joint and there is resolved edema. There is no erythema or warmth. Is able to move the ankle without any pain. Flexor, extensor tendons appear to be intact. MMT 5/5. No pain with calf compression, swelling, warmth, erythema  Assessment: Resolving capsulitis left ankle, possible gout  Plan: -All treatment options discussed with the patient including all alternatives, risks, complications.  -Overall doing better. Finished a course of Medrol Dosepak. If needed he can restart meloxicam afterwards. Discussed staying hydrated as well as diet modifications. Also recommend to follow with his primary care physician. Although he was not fasting his glucose was 186. -I will follow him as needed. Call with questions or concerns -Patient encouraged to call the office with any questions, concerns, change in symptoms.

## 2021-02-18 ENCOUNTER — Encounter (HOSPITAL_BASED_OUTPATIENT_CLINIC_OR_DEPARTMENT_OTHER): Payer: Self-pay

## 2021-02-18 ENCOUNTER — Other Ambulatory Visit: Payer: Self-pay

## 2021-02-18 ENCOUNTER — Emergency Department (HOSPITAL_BASED_OUTPATIENT_CLINIC_OR_DEPARTMENT_OTHER)
Admission: EM | Admit: 2021-02-18 | Discharge: 2021-02-18 | Disposition: A | Discharge status: Home / Self Care | Payer: 59 | Attending: Emergency Medicine | Admitting: Emergency Medicine

## 2021-02-18 DIAGNOSIS — Z85820 Personal history of malignant melanoma of skin: Secondary | ICD-10-CM | POA: Diagnosis not present

## 2021-02-18 DIAGNOSIS — B029 Zoster without complications: Secondary | ICD-10-CM | POA: Diagnosis not present

## 2021-02-18 DIAGNOSIS — R21 Rash and other nonspecific skin eruption: Secondary | ICD-10-CM | POA: Diagnosis present

## 2021-02-18 DIAGNOSIS — Z85828 Personal history of other malignant neoplasm of skin: Secondary | ICD-10-CM | POA: Insufficient documentation

## 2021-02-18 DIAGNOSIS — Z7982 Long term (current) use of aspirin: Secondary | ICD-10-CM | POA: Insufficient documentation

## 2021-02-18 HISTORY — DX: Unspecified malignant neoplasm of skin, unspecified: C44.90

## 2021-02-18 MED ORDER — VALACYCLOVIR HCL 1 G PO TABS: 1000.0000 mg | ORAL_TABLET | Freq: Three times a day (TID) | ORAL | 0 refills | Status: AC

## 2021-02-18 NOTE — ED Triage Notes (Addendum)
Rash to right buttock and top of right ankle , started 5-6 days ago per patient.  Described as "not painful, not itchy".

## 2021-02-18 NOTE — ED Provider Notes (Signed)
[facility] DEPT Provider Note   CSN: [id number] Arrival date & time: 02/18/21  0932     History Chief Complaint  Patient presents with  . Rash    John Kaiser is a 55 y.o. male.  HPI      55 year old male with history of melanoma with removal, OSA, presents with concern for rash present to the right buttock, right ankle and foot.  Reports he played golf about a week ago, unknown if he had had any bites at that time.  5 or 6 days ago he noticed a lesion to his right buttock.  Reports mild soreness, as if he has a lesion of his skin there, but denies any significant pain.  He reports he had some soreness also going down his right leg, but attributed that to walking a lot with golf.  He then noticed an area to the right medial side of his foot, and yesterday noticed new lesions popping up on his ankle.  Again notes that they are not very painful or itchy, just feels sore.  Denies fevers, history of IV drug use, cough, shortness of breath or other concerns.  Past Medical History:  Diagnosis Date  . High blood pressure   . High cholesterol   . Skin cancer     Patient Active Problem List   Diagnosis Date Noted  . Elevated glucose 01/07/2018  . Allergic rhinitis 11/25/2017  . Vertigo 11/25/2017  . Onychomycosis 10/01/2015  . OSA (obstructive sleep apnea) 07/15/2015  . Malignant melanoma ([facility]) 07/11/2015  . Elevated BP without diagnosis of hypertension 06/27/2015  . Family history of early CAD 06/27/2015  . Neoplasm of uncertain behavior of skin 06/27/2015  . Well adult exam 06/27/2015  . Cerumen impaction 06/27/2015  . Dyslipidemia 06/27/2015    Past Surgical History:  Procedure Laterality Date  . BACK SURGERY  1998       Family History  Problem Relation Age of Onset  . Arthritis Mother   . Depression Mother   . Heart disease Father 32       MI  . Hypertension Father   . Hyperlipidemia Father   . Diabetes Sister   . Diabetes Paternal  Grandmother     Social History   Tobacco Use  . Smoking status: Never Smoker  . Smokeless tobacco: Never Used  Substance Use Topics  . Alcohol use: Yes    Alcohol/week: 0.0 standard drinks    Comment: social  . Drug use: No    Comment: smoked marijuana in high school    Home Medications Prior to Admission medications   Medication Sig Start Date End Date Taking? Authorizing Provider  valACYclovir (VALTREX) 1000 MG tablet Take 1 tablet (1,000 mg total) by mouth 3 (three) times daily for 7 days. 02/18/21 02/25/21 Yes Gareth Morgan, MD  aspirin 81 MG chewable tablet     [provider]  Cholecalciferol (VITAMIN D3) 2000 UNITS capsule Take 1 capsule (2,000 Units total) by mouth daily. 06/27/15   Plotnikov, Evie Lacks, MD  ibuprofen (ADVIL,MOTRIN) 200 MG tablet Take 200 mg by mouth every 6 (six) hours as needed.    [provider]  methylPREDNISolone (MEDROL DOSEPAK) 4 MG TBPK tablet Take as directed 12/03/20   Trula Slade, DPM  Multiple Vitamin (MULTI-VITAMIN) tablet Take 1 tablet by mouth daily.    [provider]  Pitavastatin Calcium 2 MG TABS Take 1 tablet (2 mg total) by mouth daily. 01/07/18   Plotnikov, Evie Lacks, MD  Allergies    Asa [aspirin], Lipitor [atorvastatin], and Pravachol [pravastatin sodium]  Review of Systems   Review of Systems  Constitutional: Negative for fever.  Gastrointestinal: Negative for nausea and vomiting.  Skin: Positive for rash.    Physical Exam Updated Vital Signs BP (!) 181/102 (BP Location: Left Arm)   Pulse 71   Temp 98.1 F (36.7 C) (Oral)   Resp 18   Ht 6' (1.829 m)   Wt 108.9 kg   SpO2 97%   BMI 32.55 kg/m   Physical Exam Vitals and nursing note reviewed.  Constitutional:      General: He is not in acute distress.    Appearance: Normal appearance. He is not ill-appearing, toxic-appearing or diaphoretic.  HENT:     Head: Normocephalic.  Eyes:     Conjunctiva/sclera: Conjunctivae normal.   Cardiovascular:     Rate and Rhythm: Normal rate and regular rhythm.     Pulses: Normal pulses.  Pulmonary:     Effort: Pulmonary effort is normal. No respiratory distress.  Musculoskeletal:        General: No deformity or signs of injury.     Cervical back: No rigidity.  Skin:    General: Skin is warm and dry.     Coloration: Skin is not jaundiced or pale.     Comments: Papules and vesicles on erythematous base present in 3cm patch right buttock, right anterior ankle, medial foot (see photo from ankle below)   Neurological:     General: No focal deficit present.     Mental Status: He is alert and oriented to person, place, and time.       ED Results / Procedures / Treatments   Labs (all labs ordered are listed, but only abnormal results are displayed) Labs Reviewed - No data to display  EKG None  Radiology No results found.  Procedures Procedures   Medications Ordered in ED Medications - No data to display  ED Course  I have reviewed the triage vital signs and the nursing notes.  Pertinent labs & imaging results that were available during my care of the patient were reviewed by me and considered in my medical decision making (see chart for details).    MDM Rules/Calculators/A&P                          55 year old male with history of melanoma with removal, OSA, presents with concern for rash present to the right buttock, right ankle and foot.  Rash appears to be in right L4-L5 nerve distribution with appearance most consistent with shingles, although he is surprisingly only describing soreness.  Given no other areas with the lesions lower suspicion for other abnormalities, do not feel findings indicate sequela of endocarditis, not consistent with contact dermatitis, hives, SSS, TEN.  Given history of melanoma recommend follow up with dermatology.    Given new lesions beginning yesterday, rx valacyclovir. Recommend supportive care, PCP/dermatology follow up.     Final Clinical Impression(s) / ED Diagnoses Final diagnoses:  Rash  Herpes zoster without complication    Rx / DC Orders ED Discharge Orders         Ordered    valACYclovir (VALTREX) 1000 MG tablet  3 times daily        02/18/21 1107           Gareth Morgan, MD 02/18/21 1117

## 2021-02-20 ENCOUNTER — Ambulatory Visit: Payer: Self-pay | Admitting: Internal Medicine

## 2021-02-26 ENCOUNTER — Ambulatory Visit: Payer: Self-pay | Admitting: Internal Medicine

## 2021-07-25 ENCOUNTER — Other Ambulatory Visit: Payer: Self-pay | Admitting: Family

## 2021-07-25 ENCOUNTER — Telehealth: Payer: 59 | Admitting: Family

## 2021-07-25 DIAGNOSIS — U071 COVID-19: Secondary | ICD-10-CM

## 2021-07-25 MED ORDER — MOLNUPIRAVIR EUA 200MG CAPSULE: 4.0000 | ORAL_CAPSULE | Freq: Two times a day (BID) | ORAL | 0 refills | Status: DC

## 2021-07-25 MED ORDER — MOLNUPIRAVIR EUA 200MG CAPSULE: 4.0000 | ORAL_CAPSULE | Freq: Two times a day (BID) | ORAL | 0 refills | Status: AC

## 2021-07-25 NOTE — Progress Notes (Signed)
Virtual Visit Consent   John Kaiser, you are scheduled for a virtual visit with a [facility] provider today.     Just as with appointments in the office, your consent must be obtained to participate.  Your consent will be active for this visit and any virtual visit you may have with one of our providers in the next 365 days.     If you have a MyChart account, a copy of this consent can be sent to you electronically.  All virtual visits are billed to your insurance company just like a traditional visit in the office.    As this is a virtual visit, video technology does not allow for your provider to perform a traditional examination.  This may limit your provider's ability to fully assess your condition.  If your provider identifies any concerns that need to be evaluated in person or the need to arrange testing (such as labs, EKG, etc.), we will make arrangements to do so.     Although advances in technology are sophisticated, we cannot ensure that it will always work on either your end or our end.  If the connection with a video visit is poor, the visit may have to be switched to a telephone visit.  With either a video or telephone visit, we are not always able to ensure that we have a secure connection.     I need to obtain your verbal consent now.   Are you willing to proceed with your visit today?    [name] has provided verbal consent on 07/25/2021 for a virtual visit (video or telephone).   Jannifer Rodney, FNP   Date: 07/25/2021 9:41 AM   Virtual Visit via Video Note   I, Jannifer Rodney, connected with  John Kaiser  ([id number], 1966/06/10) on 07/25/21 at  9:45 AM EDT by a video-enabled telemedicine application and verified that I am speaking with the correct person using two identifiers.  Location: Patient: Virtual Visit Location Patient: Home Provider: Virtual Visit Location Provider: Home   I discussed the limitations of evaluation and management by telemedicine and the  availability of in person appointments. The patient expressed understanding and agreed to proceed.    History of Present Illness: John Kaiser is a 55 y.o. who identifies as a male who was assigned male at birth, and is being seen today for COVID. He reports his symptoms started two days and tested positive yesterday.   HPI: Cough This is a new problem. The current episode started in the past 7 days. The problem has been gradually worsening. The problem occurs every few minutes. The cough is Non-productive. Associated symptoms include a fever, headaches, myalgias, nasal congestion, postnasal drip and a sore throat. Pertinent negatives include no chills, ear congestion, ear pain, shortness of breath or wheezing. He has tried rest (motrin) for the symptoms. The treatment provided mild relief.   Problems:  Patient Active Problem List   Diagnosis Date Noted   Elevated glucose 01/07/2018   Allergic rhinitis 11/25/2017   Vertigo 11/25/2017   Onychomycosis 10/01/2015   OSA (obstructive sleep apnea) 07/15/2015   Malignant melanoma ([facility]) 07/11/2015   Elevated BP without diagnosis of hypertension 06/27/2015   Family history of early CAD 06/27/2015   Neoplasm of uncertain behavior of skin 06/27/2015   Well adult exam 06/27/2015   Cerumen impaction 06/27/2015   Dyslipidemia 06/27/2015    Allergies:  Allergies  Allergen Reactions   Asa [Aspirin] Hives   Lipitor [Atorvastatin]     ?  Pravachol [Pravastatin Sodium]     Achy    Medications:  Current Outpatient Medications:    molnupiravir EUA (LAGEVRIO) 200 mg CAPS capsule, Take 4 capsules (800 mg total) by mouth 2 (two) times daily for 5 days., Disp: 40 capsule, Rfl: 0   aspirin 81 MG chewable tablet, , Disp: , Rfl:    ibuprofen (ADVIL,MOTRIN) 200 MG tablet, Take 200 mg by mouth every 6 (six) hours as needed., Disp: , Rfl:    Multiple Vitamin (MULTI-VITAMIN) tablet, Take 1 tablet by mouth daily., Disp: , Rfl:    Observations/Objective: Patient is well-developed, well-nourished in no acute distress.  Resting comfortably in bed at home.  Head is normocephalic, atraumatic.  No labored breathing.  Speech is clear and coherent with logical content.  Patient is alert and oriented at baseline.  Nasal congestion  Assessment and Plan: 1. COVID-19 - molnupiravir EUA (LAGEVRIO) 200 mg CAPS capsule; Take 4 capsules (800 mg total) by mouth 2 (two) times daily for 5 days.  Dispense: 40 capsule; Refill: 0 COVID positive, rest, force fluids, tylenol as needed, Quarantine for at least 5 days and fever free then wear your mask out in public from day 6-10,  report any worsening symptoms such as increased shortness of breath, swelling, or continued high fevers.  Possible adverse effects discussed    Follow Up Instructions: I discussed the assessment and treatment plan with the patient. The patient was provided an opportunity to ask questions and all were answered. The patient agreed with the plan and demonstrated an understanding of the instructions.  A copy of instructions were sent to the patient via MyChart unless otherwise noted below.     The patient was advised to call back or seek an in-person evaluation if the symptoms worsen or if the condition fails to improve as anticipated.  Time:  I spent 7 minutes with the patient via telehealth technology discussing the above problems/concerns.    Jannifer Rodney, FNP

## 2022-11-10 ENCOUNTER — Encounter: Payer: Self-pay | Admitting: Internal Medicine

## 2022-11-10 ENCOUNTER — Ambulatory Visit (INDEPENDENT_AMBULATORY_CARE_PROVIDER_SITE_OTHER): Payer: Medicaid Other | Admitting: Internal Medicine

## 2022-11-10 VITALS — BP 128/78 | HR 89 | Temp 98.4°F | Ht 72.0 in | Wt 231.0 lb

## 2022-11-10 DIAGNOSIS — J069 Acute upper respiratory infection, unspecified: Secondary | ICD-10-CM | POA: Insufficient documentation

## 2022-11-10 DIAGNOSIS — H6123 Impacted cerumen, bilateral: Secondary | ICD-10-CM

## 2022-11-10 MED ORDER — AZITHROMYCIN 250 MG PO TABS: ORAL_TABLET | ORAL | 0 refills | Status: DC

## 2022-11-10 NOTE — Assessment & Plan Note (Signed)
New Z pack

## 2022-11-10 NOTE — Progress Notes (Signed)
Subjective:  Patient ID: John Kaiser, male    DOB: [birth year]  Age: 57 y.o. MRN: [id number]  CC: URI (Had it about a week , it comes and goes )   HPI John Kaiser presents for a new pt (not seen in >3 years) - URI sx's x 1 week  Outpatient Medications Prior to Visit  Medication Sig Dispense Refill   aspirin 81 MG chewable tablet      dextromethorphan-guaiFENesin (MUCINEX DM) 30-600 MG 12hr tablet Take 1 tablet by mouth 2 (two) times daily.     ibuprofen (ADVIL,MOTRIN) 200 MG tablet Take 200 mg by mouth every 6 (six) hours as needed.     Multiple Vitamin (MULTI-VITAMIN) tablet Take 1 tablet by mouth daily.     No facility-administered medications prior to visit.    ROS: Review of Systems  Constitutional:  Negative for appetite change, fatigue and unexpected weight change.  HENT:  Positive for congestion, postnasal drip and rhinorrhea. Negative for nosebleeds, sneezing, sore throat and trouble swallowing.   Eyes:  Negative for itching and visual disturbance.  Respiratory:  Positive for cough.   Cardiovascular:  Negative for chest pain, palpitations and leg swelling.  Gastrointestinal:  Negative for abdominal distention, blood in stool, diarrhea and nausea.  Genitourinary:  Negative for frequency and hematuria.  Musculoskeletal:  Negative for back pain, gait problem, joint swelling and neck pain.  Skin:  Negative for rash.  Neurological:  Negative for dizziness, tremors, speech difficulty and weakness.  Psychiatric/Behavioral:  Negative for agitation, dysphoric mood and sleep disturbance. The patient is not nervous/anxious.     Objective:  BP 128/78 (BP Location: Left Arm, Patient Position: Sitting, Cuff Size: Normal)   Pulse 89   Temp 98.4 F (36.9 C) (Oral)   Ht 6' (1.829 m)   Wt 231 lb (104.8 kg)   SpO2 97%   BMI 31.33 kg/m   BP Readings from Last 3 Encounters:  11/10/22 128/78  02/18/21 (!) 181/102  05/30/18 132/74    Wt Readings from Last 3 Encounters:   11/10/22 231 lb (104.8 kg)  02/18/21 240 lb (108.9 kg)  05/30/18 249 lb (112.9 kg)    Physical Exam Constitutional:      General: He is not in acute distress.    Appearance: He is well-developed. He is obese.     Comments: NAD  Eyes:     Conjunctiva/sclera: Conjunctivae normal.     Pupils: Pupils are equal, round, and reactive to light.  Neck:     Thyroid: No thyromegaly.     Vascular: No JVD.  Cardiovascular:     Rate and Rhythm: Normal rate and regular rhythm.     Heart sounds: Normal heart sounds. No murmur heard.    No friction rub. No gallop.  Pulmonary:     Effort: Pulmonary effort is normal. No respiratory distress.     Breath sounds: Normal breath sounds. No wheezing or rales.  Chest:     Chest wall: No tenderness.  Abdominal:     General: Bowel sounds are normal. There is no distension.     Palpations: Abdomen is soft. There is no mass.     Tenderness: There is no abdominal tenderness. There is no guarding or rebound.  Musculoskeletal:        General: No tenderness. Normal range of motion.     Cervical back: Normal range of motion.  Lymphadenopathy:     Cervical: No cervical adenopathy.  Skin:    General: Skin  is warm and dry.     Findings: No rash.  Neurological:     Mental Status: He is alert and oriented to person, place, and time.     Cranial Nerves: No cranial nerve deficit.     Motor: No abnormal muscle tone.     Coordination: Coordination normal.     Gait: Gait normal.     Deep Tendon Reflexes: Reflexes are normal and symmetric.  Psychiatric:        Behavior: Behavior normal.        Thought Content: Thought content normal.        Judgment: Judgment normal.   Eryth throat B wax  Lab Results  Component Value Date   WBC 8.2 01/06/2018   HGB 15.4 01/06/2018   HCT 44.4 01/06/2018   PLT 222.0 01/06/2018   GLUCOSE 186 (H) 12/03/2020   CHOL 224 (H) 01/06/2018   TRIG 221.0 (H) 01/06/2018   HDL 36.20 (L) 01/06/2018   LDLDIRECT 141.0 01/06/2018    ALT 23 01/06/2018   AST 21 01/06/2018   NA 135 12/03/2020   K 4.4 12/03/2020   CL 98 12/03/2020   CREATININE 1.16 12/03/2020   BUN 16 12/03/2020   CO2 22 12/03/2020   TSH 2.02 01/06/2018   PSA 1.85 01/06/2018    No results found.  Assessment & Plan:   Problem List Items Addressed This Visit       Respiratory   Upper respiratory infection - Primary    New Z pack        Nervous and Auditory   Cerumen impaction    Debrox adviced         No orders of the defined types were placed in this encounter.     Follow-up: No follow-ups on file.  Sonda Primes, MD

## 2022-11-10 NOTE — Assessment & Plan Note (Signed)
Debrox adviced

## 2022-11-12 ENCOUNTER — Encounter: Payer: Self-pay | Admitting: Internal Medicine

## 2022-11-12 ENCOUNTER — Ambulatory Visit (INDEPENDENT_AMBULATORY_CARE_PROVIDER_SITE_OTHER): Payer: Medicaid Other | Admitting: Internal Medicine

## 2022-11-12 VITALS — BP 132/80 | HR 85 | Temp 97.9°F | Ht 72.0 in | Wt 230.0 lb

## 2022-11-12 DIAGNOSIS — Z0000 Encounter for general adult medical examination without abnormal findings: Secondary | ICD-10-CM

## 2022-11-12 DIAGNOSIS — Z85820 Personal history of malignant melanoma of skin: Secondary | ICD-10-CM | POA: Insufficient documentation

## 2022-11-12 DIAGNOSIS — D485 Neoplasm of uncertain behavior of skin: Secondary | ICD-10-CM | POA: Diagnosis not present

## 2022-11-12 DIAGNOSIS — Z1211 Encounter for screening for malignant neoplasm of colon: Secondary | ICD-10-CM | POA: Diagnosis not present

## 2022-11-12 DIAGNOSIS — E1165 Type 2 diabetes mellitus with hyperglycemia: Secondary | ICD-10-CM

## 2022-11-12 DIAGNOSIS — N481 Balanitis: Secondary | ICD-10-CM | POA: Diagnosis not present

## 2022-11-12 DIAGNOSIS — Z125 Encounter for screening for malignant neoplasm of prostate: Secondary | ICD-10-CM | POA: Diagnosis not present

## 2022-11-12 DIAGNOSIS — R739 Hyperglycemia, unspecified: Secondary | ICD-10-CM | POA: Insufficient documentation

## 2022-11-12 DIAGNOSIS — E785 Hyperlipidemia, unspecified: Secondary | ICD-10-CM | POA: Diagnosis not present

## 2022-11-12 LAB — CBC WITH DIFFERENTIAL/PLATELET
Basophils Absolute: 0.1 10*3/uL (ref 0.0–0.1)
Basophils Relative: 0.8 % (ref 0.0–3.0)
Eosinophils Absolute: 0.3 10*3/uL (ref 0.0–0.7)
Eosinophils Relative: 3.7 % (ref 0.0–5.0)
HCT: 47.6 % (ref 39.0–52.0)
Hemoglobin: 16.5 g/dL (ref 13.0–17.0)
Lymphocytes Relative: 39.5 % (ref 12.0–46.0)
Lymphs Abs: 3.2 10*3/uL (ref 0.7–4.0)
MCHC: 34.7 g/dL (ref 30.0–36.0)
MCV: 89.1 fl (ref 78.0–100.0)
Monocytes Absolute: 0.3 10*3/uL (ref 0.1–1.0)
Monocytes Relative: 4 % (ref 3.0–12.0)
Neutro Abs: 4.2 10*3/uL (ref 1.4–7.7)
Neutrophils Relative %: 52 % (ref 43.0–77.0)
Platelets: 258 10*3/uL (ref 150.0–400.0)
RBC: 5.34 Mil/uL (ref 4.22–5.81)
RDW: 12.5 % (ref 11.5–15.5)
WBC: 8.1 10*3/uL (ref 4.0–10.5)

## 2022-11-12 LAB — URINALYSIS, ROUTINE W REFLEX MICROSCOPIC
Ketones, ur: 15 — AB
Leukocytes,Ua: NEGATIVE
Nitrite: NEGATIVE
Specific Gravity, Urine: >=1.03 — AB (ref 1.000–1.030)
Total Protein, Urine: 100 — AB
Urine Glucose: >=1000 — AB
Urobilinogen, UA: 0.2 (ref 0.0–1.0)
pH: 6 (ref 5.0–8.0)

## 2022-11-12 LAB — LIPID PANEL
Cholesterol: 282 mg/dL — ABNORMAL HIGH (ref 0–200)
HDL: 37.9 mg/dL — ABNORMAL LOW (ref >39.00)
NonHDL: 244.25
Total CHOL/HDL Ratio: 7
Triglycerides: 331 mg/dL — ABNORMAL HIGH (ref 0.0–149.0)
VLDL: 66.2 mg/dL — ABNORMAL HIGH (ref 0.0–40.0)

## 2022-11-12 LAB — COMPREHENSIVE METABOLIC PANEL
ALT: 25 U/L (ref 0–53)
AST: 21 U/L (ref 0–37)
Albumin: 4.7 g/dL (ref 3.5–5.2)
Alkaline Phosphatase: 56 U/L (ref 39–117)
BUN: 14 mg/dL (ref 6–23)
CO2: 28 mEq/L (ref 19–32)
Calcium: 10 mg/dL (ref 8.4–10.5)
Chloride: 97 mEq/L (ref 96–112)
Creatinine, Ser: 1.12 mg/dL (ref 0.40–1.50)
GFR: 72.58 mL/min (ref >60.00)
Glucose, Bld: 336 mg/dL — ABNORMAL HIGH (ref 70–99)
Potassium: 4.7 mEq/L (ref 3.5–5.1)
Sodium: 136 mEq/L (ref 135–145)
Total Bilirubin: 0.7 mg/dL (ref 0.2–1.2)
Total Protein: 7.7 g/dL (ref 6.0–8.3)

## 2022-11-12 LAB — LDL CHOLESTEROL, DIRECT: Direct LDL: 196 mg/dL

## 2022-11-12 LAB — PSA: PSA: 2.78 ng/mL (ref 0.10–4.00)

## 2022-11-12 LAB — HEMOGLOBIN A1C: Hgb A1c MFr Bld: 11.5 % — ABNORMAL HIGH (ref 4.6–6.5)

## 2022-11-12 LAB — TSH: TSH: 1.2 u[IU]/mL (ref 0.35–5.50)

## 2022-11-12 MED ORDER — CICLOPIROX 8 % EX SOLN: Freq: Every day | CUTANEOUS | 1 refills | Status: AC

## 2022-11-12 MED ORDER — CLOTRIMAZOLE-BETAMETHASONE 1-0.05 % EX CREA: 1.0000 | TOPICAL_CREAM | Freq: Two times a day (BID) | CUTANEOUS | 1 refills | Status: AC

## 2022-11-12 NOTE — Patient Instructions (Signed)

## 2022-11-12 NOTE — Assessment & Plan Note (Signed)
Derm ref

## 2022-11-12 NOTE — Progress Notes (Signed)
Subjective:  Patient ID: John Kaiser, male    DOB: Jan 28, 1966  Age: 57 y.o. MRN: [id number]  CC: Annual Exam (Has a little sore on his gentials has been itchy )   HPI John Kaiser presents for a well He is complaining of penile skin irritation.  He is worried about his moles.  He has a history of melanoma  Outpatient Medications Prior to Visit  Medication Sig Dispense Refill   aspirin 81 MG chewable tablet      azithromycin (ZITHROMAX Z-PAK) 250 MG tablet As directed 6 tablet 0   dextromethorphan-guaiFENesin (MUCINEX DM) 30-600 MG 12hr tablet Take 1 tablet by mouth 2 (two) times daily.     ibuprofen (ADVIL,MOTRIN) 200 MG tablet Take 200 mg by mouth every 6 (six) hours as needed.     Multiple Vitamin (MULTI-VITAMIN) tablet Take 1 tablet by mouth daily.     No facility-administered medications prior to visit.    ROS: Review of Systems  Constitutional:  Positive for unexpected weight change. Negative for appetite change and fatigue.  HENT:  Negative for congestion, nosebleeds, sneezing, sore throat and trouble swallowing.   Eyes:  Negative for itching and visual disturbance.  Respiratory:  Negative for cough.   Cardiovascular:  Negative for chest pain, palpitations and leg swelling.  Gastrointestinal:  Negative for abdominal distention, blood in stool, diarrhea and nausea.  Genitourinary:  Negative for frequency and hematuria.  Musculoskeletal:  Negative for back pain, gait problem, joint swelling and neck pain.  Skin:  Positive for color change. Negative for rash.  Neurological:  Negative for dizziness, tremors, speech difficulty and weakness.  Psychiatric/Behavioral:  Negative for agitation, dysphoric mood and sleep disturbance. The patient is not nervous/anxious.     Objective:  BP 132/80 (BP Location: Left Arm, Patient Position: Sitting, Cuff Size: Normal)   Pulse 85   Temp 97.9 F (36.6 C) (Oral)   Ht 6' (1.829 m)   Wt 230 lb (104.3 kg)   SpO2 99%   BMI 31.19 kg/m    BP Readings from Last 3 Encounters:  11/12/22 132/80  11/10/22 128/78  02/18/21 (!) 181/102    Wt Readings from Last 3 Encounters:  11/12/22 230 lb (104.3 kg)  11/10/22 231 lb (104.8 kg)  02/18/21 240 lb (108.9 kg)    Physical Exam Constitutional:      General: He is not in acute distress.    Appearance: He is well-developed. He is obese. He is not toxic-appearing.     Comments: NAD  Eyes:     Conjunctiva/sclera: Conjunctivae normal.     Pupils: Pupils are equal, round, and reactive to light.  Neck:     Thyroid: No thyromegaly.     Vascular: No JVD.  Cardiovascular:     Rate and Rhythm: Normal rate and regular rhythm.     Heart sounds: Normal heart sounds. No murmur heard.    No friction rub. No gallop.  Pulmonary:     Effort: Pulmonary effort is normal. No respiratory distress.     Breath sounds: Normal breath sounds. No wheezing or rales.  Chest:     Chest wall: No tenderness.  Abdominal:     General: Bowel sounds are normal. There is no distension.     Palpations: Abdomen is soft. There is no mass.     Tenderness: There is no abdominal tenderness. There is no guarding or rebound.  Musculoskeletal:        General: No tenderness. Normal range of motion.  Cervical back: Normal range of motion.  Lymphadenopathy:     Cervical: No cervical adenopathy.  Skin:    General: Skin is warm and dry.     Findings: Lesion and rash present.  Neurological:     Mental Status: He is alert and oriented to person, place, and time.     Cranial Nerves: No cranial nerve deficit.     Motor: No abnormal muscle tone.     Coordination: Coordination normal.     Gait: Gait normal.     Deep Tendon Reflexes: Reflexes are normal and symmetric.  Psychiatric:        Behavior: Behavior normal.        Thought Content: Thought content normal.        Judgment: Judgment normal.   Wax - ears Foreskin and g penis w/irritation 3 mm mole L cheek Moles on trunk  I spent 22 minutes in  addition to time for CPX wellness examination in preparing to see the patient by review of recent labs, imaging and procedures, obtaining and reviewing separately obtained history, communicating with the patient, ordering medications, tests or procedures, and documenting clinical information in the EHR including the differential diagnosis, treatment, and any further evaluation and other management of moles, hyperglycemia.         Lab Results  Component Value Date   WBC 8.1 11/12/2022   HGB 16.5 11/12/2022   HCT 47.6 11/12/2022   PLT 258.0 11/12/2022   GLUCOSE 336 (H) 11/12/2022   CHOL 282 (H) 11/12/2022   TRIG 331.0 (H) 11/12/2022   HDL 37.90 (L) 11/12/2022   LDLDIRECT 196.0 11/12/2022   ALT 25 11/12/2022   AST 21 11/12/2022   NA 136 11/12/2022   K 4.7 11/12/2022   CL 97 11/12/2022   CREATININE 1.12 11/12/2022   BUN 14 11/12/2022   CO2 28 11/12/2022   TSH 1.20 11/12/2022   PSA 2.78 11/12/2022   HGBA1C 11.5 (H) 11/12/2022    No results found.  Assessment & Plan:   Problem List Items Addressed This Visit       Endocrine   Type 2 diabetes mellitus (HCC)    New onset.  Start repaglinide and low-dose metformin.  Hydrate well.  Glucometer.  Diet and exercise. Return to clinic in 1-2 weeks      Relevant Medications   metFORMIN (GLUCOPHAGE) 500 MG tablet   repaglinide (PRANDIN) 1 MG tablet     Musculoskeletal and Integument   Neoplasm of uncertain behavior of skin    Derm ref      Relevant Orders   Ambulatory referral to Dermatology     Genitourinary   Balanitis    Lotrisone cream Check glucose        Other   Well adult exam - Primary     We discussed age appropriate health related issues, including available/recomended screening tests and vaccinations. Labs were ordered to be later reviewed . All questions were answered. We discussed one or more of the following - seat belt use, use of sunscreen/sun exposure exercise, fall risk reduction, second hand smoke  exposure, firearm use and storage, seat belt use, a need for adhering to healthy diet and exercise. Labs were ordered.  All questions were answered.      Relevant Orders   TSH (Completed)   Urinalysis   CBC with Differential/Platelet (Completed)   Lipid panel (Completed)   PSA (Completed)   Comprehensive metabolic panel (Completed)   Hemoglobin A1c (Completed)   Hyperglycemia  Relevant Orders   Hemoglobin A1c (Completed)   History of melanoma   Relevant Orders   Ambulatory referral to Dermatology   Dyslipidemia   Relevant Orders   CT CARDIAC SCORING (SELF PAY ONLY)      Meds ordered this encounter  Medications   clotrimazole-betamethasone (LOTRISONE) cream    Sig: Apply 1 Application topically 2 (two) times daily.    Dispense:  30 g    Refill:  1   ciclopirox (PENLAC) 8 % solution    Sig: Apply topically at bedtime. Apply over nail and surrounding skin daily    Dispense:  6.6 mL    Refill:  1   metFORMIN (GLUCOPHAGE) 500 MG tablet    Sig: Take 1 tablet (500 mg total) by mouth daily with breakfast.    Dispense:  90 tablet    Refill:  3   repaglinide (PRANDIN) 1 MG tablet    Sig: Take 1 tablet (1 mg total) by mouth 3 (three) times daily before meals.    Dispense:  90 tablet    Refill:  5   Accu-Chek Softclix Lancets lancets    Sig: Use as instructed    Dispense:  100 each    Refill:  12   Blood Glucose Monitoring Suppl (ACCU-CHEK AVIVA PLUS) w/Device KIT    Sig: As directed    Dispense:  1 kit    Refill:  0   glucose blood (ACCU-CHEK AVIVA PLUS) test strip    Sig: Use as instructed twice daily    Dispense:  100 each    Refill:  12      Follow-up: Return in about 3 months (around 02/11/2023) for a follow-up visit.  Sonda Primes, MD

## 2022-11-12 NOTE — Assessment & Plan Note (Signed)

## 2022-11-12 NOTE — Assessment & Plan Note (Signed)
Lotrisone cream Check glucose

## 2022-11-13 ENCOUNTER — Telehealth: Payer: Self-pay | Admitting: Internal Medicine

## 2022-11-13 DIAGNOSIS — E119 Type 2 diabetes mellitus without complications: Secondary | ICD-10-CM | POA: Insufficient documentation

## 2022-11-13 MED ORDER — ACCU-CHEK SOFTCLIX LANCETS MISC: 12 refills | Status: DC

## 2022-11-13 MED ORDER — ACCU-CHEK AVIVA PLUS VI STRP: ORAL_STRIP | 12 refills | Status: DC

## 2022-11-13 MED ORDER — ACCU-CHEK AVIVA PLUS W/DEVICE KIT: PACK | 0 refills | Status: AC

## 2022-11-13 MED ORDER — METFORMIN HCL 500 MG PO TABS: 500.0000 mg | ORAL_TABLET | Freq: Every day | ORAL | 3 refills | Status: DC

## 2022-11-13 MED ORDER — REPAGLINIDE 1 MG PO TABS: 1.0000 mg | ORAL_TABLET | Freq: Three times a day (TID) | ORAL | 5 refills | Status: DC

## 2022-11-13 NOTE — Telephone Encounter (Signed)
Called pt no answer LMOM pls keep 11/26/22 appt he will go over labs at that time.Marland KitchenJohny Chess

## 2022-11-13 NOTE — Assessment & Plan Note (Signed)
New onset.  Start repaglinide and low-dose metformin.  Hydrate well.  Glucometer.  Diet and exercise. Return to clinic in 1-2 weeks

## 2022-11-13 NOTE — Telephone Encounter (Signed)
Called pt.

## 2022-11-13 NOTE — Telephone Encounter (Signed)
PT calls today in regards to recent lipid panel results. PT states he had reviewed these results and had some concerns about his A1C and cholesterol levels. I had set PT up with the next available appointment with Dr.Plotnikov but they are wondering at this point if they should start monitoring their blood sugar levels at least or if a medication would be recommended for this?  CB: [phone]

## 2022-11-17 ENCOUNTER — Ambulatory Visit (HOSPITAL_BASED_OUTPATIENT_CLINIC_OR_DEPARTMENT_OTHER)
Admission: RE | Admit: 2022-11-17 | Discharge: 2022-11-17 | Disposition: A | Discharge status: Home / Self Care | Payer: Medicaid Other | Source: Ambulatory Visit | Attending: Internal Medicine | Admitting: Internal Medicine

## 2022-11-17 ENCOUNTER — Telehealth: Payer: Self-pay | Admitting: Internal Medicine

## 2022-11-17 ENCOUNTER — Encounter (HOSPITAL_BASED_OUTPATIENT_CLINIC_OR_DEPARTMENT_OTHER): Payer: Self-pay

## 2022-11-17 DIAGNOSIS — E785 Hyperlipidemia, unspecified: Secondary | ICD-10-CM

## 2022-11-17 NOTE — Telephone Encounter (Signed)
Patient would like to know if it is ok to check his blood sugar twice a day - in the morning and in the evening.  If you want him to do other wise please let him know.  Patient's number  [phone]

## 2022-11-18 NOTE — Telephone Encounter (Signed)
Yes, thank you.

## 2022-11-19 NOTE — Telephone Encounter (Signed)
Called and notified pt MD response. Pt verbalized understanding.

## 2022-11-20 ENCOUNTER — Encounter: Payer: Self-pay | Admitting: Internal Medicine

## 2022-11-20 DIAGNOSIS — K760 Fatty (change of) liver, not elsewhere classified: Secondary | ICD-10-CM | POA: Insufficient documentation

## 2022-11-20 DIAGNOSIS — I2510 Atherosclerotic heart disease of native coronary artery without angina pectoris: Secondary | ICD-10-CM | POA: Insufficient documentation

## 2022-11-26 ENCOUNTER — Encounter: Payer: Self-pay | Admitting: Internal Medicine

## 2022-11-26 ENCOUNTER — Ambulatory Visit (INDEPENDENT_AMBULATORY_CARE_PROVIDER_SITE_OTHER): Payer: Medicaid Other | Admitting: Internal Medicine

## 2022-11-26 VITALS — BP 122/78 | HR 50 | Temp 97.9°F | Ht 72.0 in | Wt 231.0 lb

## 2022-11-26 DIAGNOSIS — I2583 Coronary atherosclerosis due to lipid rich plaque: Secondary | ICD-10-CM

## 2022-11-26 DIAGNOSIS — E1165 Type 2 diabetes mellitus with hyperglycemia: Secondary | ICD-10-CM

## 2022-11-26 DIAGNOSIS — I2510 Atherosclerotic heart disease of native coronary artery without angina pectoris: Secondary | ICD-10-CM | POA: Diagnosis not present

## 2022-11-26 DIAGNOSIS — R030 Elevated blood-pressure reading, without diagnosis of hypertension: Secondary | ICD-10-CM

## 2022-11-26 MED ORDER — TIRZEPATIDE 2.5 MG/0.5ML ~~LOC~~ SOAJ: 2.5000 mg | SUBCUTANEOUS | 3 refills | Status: DC

## 2022-11-26 NOTE — Assessment & Plan Note (Signed)
Coronary calcium CT score is 11.5.  Fish oil and aspirin - discussed

## 2022-11-26 NOTE — Assessment & Plan Note (Signed)
Will add Mounjaro Discussed

## 2022-11-26 NOTE — Assessment & Plan Note (Signed)
Cont w/wt loss 

## 2022-11-26 NOTE — Progress Notes (Signed)
Subjective:  Patient ID: John Kaiser, male    DOB: [birth year]  Age: 57 y.o. MRN: [id number]  CC: Follow-up   HPI John Kaiser presents for DM, CAD, HTN  Outpatient Medications Prior to Visit  Medication Sig Dispense Refill   Accu-Chek Softclix Lancets lancets Use as instructed 100 each 12   aspirin 81 MG chewable tablet      azithromycin (ZITHROMAX Z-PAK) 250 MG tablet As directed 6 tablet 0   Blood Glucose Monitoring Suppl (ACCU-CHEK AVIVA PLUS) w/Device KIT As directed 1 kit 0   ciclopirox (PENLAC) 8 % solution Apply topically at bedtime. Apply over nail and surrounding skin daily 6.6 mL 1   clotrimazole-betamethasone (LOTRISONE) cream Apply 1 Application topically 2 (two) times daily. 30 g 1   glucose blood (ACCU-CHEK AVIVA PLUS) test strip Use as instructed twice daily 100 each 12   metFORMIN (GLUCOPHAGE) 500 MG tablet Take 1 tablet (500 mg total) by mouth daily with breakfast. 90 tablet 3   Multiple Vitamin (MULTI-VITAMIN) tablet Take 1 tablet by mouth daily.     repaglinide (PRANDIN) 1 MG tablet Take 1 tablet (1 mg total) by mouth 3 (three) times daily before meals. 90 tablet 5   dextromethorphan-guaiFENesin (MUCINEX DM) 30-600 MG 12hr tablet Take 1 tablet by mouth 2 (two) times daily.     ibuprofen (ADVIL,MOTRIN) 200 MG tablet Take 200 mg by mouth every 6 (six) hours as needed.     No facility-administered medications prior to visit.    ROS: Review of Systems  Constitutional:  Negative for appetite change, fatigue and unexpected weight change.  HENT:  Negative for congestion, nosebleeds, sneezing, sore throat and trouble swallowing.   Eyes:  Negative for itching and visual disturbance.  Respiratory:  Negative for cough.   Cardiovascular:  Negative for chest pain, palpitations and leg swelling.  Gastrointestinal:  Negative for abdominal distention, blood in stool, diarrhea and nausea.  Genitourinary:  Negative for frequency and hematuria.  Musculoskeletal:  Negative for  back pain, gait problem, joint swelling and neck pain.  Skin:  Negative for rash.  Neurological:  Negative for dizziness, tremors, speech difficulty and weakness.  Psychiatric/Behavioral:  Negative for agitation, dysphoric mood and sleep disturbance. The patient is not nervous/anxious.     Objective:  BP 122/78 (BP Location: Left Arm, Patient Position: Sitting, Cuff Size: Normal)   Pulse (!) 50   Temp 97.9 F (36.6 C) (Oral)   Ht 6' (1.829 m)   Wt 231 lb (104.8 kg)   SpO2 100%   BMI 31.33 kg/m   BP Readings from Last 3 Encounters:  11/26/22 122/78  11/12/22 132/80  11/10/22 128/78    Wt Readings from Last 3 Encounters:  11/26/22 231 lb (104.8 kg)  11/12/22 230 lb (104.3 kg)  11/10/22 231 lb (104.8 kg)    Physical Exam Constitutional:      General: He is not in acute distress.    Appearance: He is well-developed.     Comments: NAD  Eyes:     Conjunctiva/sclera: Conjunctivae normal.     Pupils: Pupils are equal, round, and reactive to light.  Neck:     Thyroid: No thyromegaly.     Vascular: No JVD.  Cardiovascular:     Rate and Rhythm: Normal rate and regular rhythm.     Heart sounds: Normal heart sounds. No murmur heard.    No friction rub. No gallop.  Pulmonary:     Effort: Pulmonary effort is normal. No respiratory distress.  Breath sounds: Normal breath sounds. No wheezing or rales.  Chest:     Chest wall: No tenderness.  Abdominal:     General: Bowel sounds are normal. There is no distension.     Palpations: Abdomen is soft. There is no mass.     Tenderness: There is no abdominal tenderness. There is no guarding or rebound.  Musculoskeletal:        General: No tenderness. Normal range of motion.     Cervical back: Normal range of motion.  Lymphadenopathy:     Cervical: No cervical adenopathy.  Skin:    General: Skin is warm and dry.     Findings: No rash.  Neurological:     Mental Status: He is alert and oriented to person, place, and time.      Cranial Nerves: No cranial nerve deficit.     Motor: No abnormal muscle tone.     Coordination: Coordination normal.     Gait: Gait normal.     Deep Tendon Reflexes: Reflexes are normal and symmetric.  Psychiatric:        Behavior: Behavior normal.        Thought Content: Thought content normal.        Judgment: Judgment normal.     Lab Results  Component Value Date   WBC 8.1 11/12/2022   HGB 16.5 11/12/2022   HCT 47.6 11/12/2022   PLT 258.0 11/12/2022   GLUCOSE 336 (H) 11/12/2022   CHOL 282 (H) 11/12/2022   TRIG 331.0 (H) 11/12/2022   HDL 37.90 (L) 11/12/2022   LDLDIRECT 196.0 11/12/2022   ALT 25 11/12/2022   AST 21 11/12/2022   NA 136 11/12/2022   K 4.7 11/12/2022   CL 97 11/12/2022   CREATININE 1.12 11/12/2022   BUN 14 11/12/2022   CO2 28 11/12/2022   TSH 1.20 11/12/2022   PSA 2.78 11/12/2022   HGBA1C 11.5 (H) 11/12/2022    CT CARDIAC SCORING (SELF PAY ONLY)  Addendum Date: 11/17/2022   ADDENDUM REPORT: 11/17/2022 10:29 EXAM: OVER-READ INTERPRETATION  CT CHEST The following report is a limited chest CT over-read performed by radiologist Dr. Irma Newness [facility] on 11/17/2022. This over-read does not include interpretation of cardiac or coronary anatomy or pathology. The coronary calcium score interpretation by the cardiologist is attached. COMPARISON:  None. FINDINGS: Mediastinum/Nodes: No enlarged lymph nodes within the visualized mediastinum. Lungs/Pleura: There is no pleural effusion. Small calcified granuloma in the lingula. The visualized lungs are otherwise clear. Upper abdomen: Probable hepatic steatosis. Musculoskeletal/Chest wall: No chest wall mass or suspicious osseous findings within the visualized chest. Mild thoracic spondylosis. IMPRESSION: 1. No significant extracardiac findings within the visualized lower chest. 2. Probable hepatic steatosis. Electronically Signed   By: Carey Bullocks M.D.   On: 11/17/2022 10:29   Result Date:  11/17/2022 CLINICAL DATA:  Cardiovascular Disease Risk stratification EXAM: Coronary Calcium Score TECHNIQUE: A gated, non-contrast computed tomography scan of the heart was performed using 3mm slice thickness. Axial images were analyzed on a dedicated workstation. Calcium scoring of the coronary arteries was performed using the Agatston method. FINDINGS: Coronary Calcium Score: Left main: 0 Left anterior descending artery: 5.19 Left circumflex artery: 6.71 Right coronary artery: 0 Total: 11.9 Percentile: 46 Pericardium: Normal. Ascending Aorta: Normal caliber.  Mild aortic atherosclerosis. Non-cardiac: See separate report from [facility]. IMPRESSION: Coronary calcium score of 11.9. This was 68 percentile for age-, race-, and sex-matched controls. Mild aortic atherosclerosis. RECOMMENDATIONS: Coronary artery calcium (CAC) score is a  strong predictor of incident coronary heart disease (CHD) and provides predictive information beyond traditional risk factors. CAC scoring is reasonable to use in the decision to withhold, postpone, or initiate statin therapy in intermediate-risk or selected borderline-risk asymptomatic adults (age 6-75 years and LDL-C >=70 to <190 mg/dL) who do not have diabetes or established atherosclerotic cardiovascular disease (ASCVD).* In intermediate-risk (10-year ASCVD risk >=7.5% to <20%) adults or selected borderline-risk (10-year ASCVD risk >=5% to <7.5%) adults in whom a CAC score is measured for the purpose of making a treatment decision the following recommendations have been made: If CAC=0, it is reasonable to withhold statin therapy and reassess in 5 to 10 years, as long as higher risk conditions are absent (diabetes mellitus, family history of premature CHD in first degree relatives (males <55 years; females <65 years), cigarette smoking, or LDL >=190 mg/dL). If CAC is 1 to 99, it is reasonable to initiate statin therapy for patients >=86 years of age. If CAC is >=100 or  >=75th percentile, it is reasonable to initiate statin therapy at any age. Cardiology referral should be considered for patients with CAC scores >=400 or >=75th percentile. *2018 AHA/ACC/AACVPR/AAPA/ABC/ACPM/ADA/AGS/APhA/ASPC/NLA/PCNA Guideline on the Management of Blood Cholesterol: A Report of the American College of Cardiology/American Heart Association Task Force on Clinical Practice Guidelines. J Am Coll Cardiol. 2019;73(24):3168-3209. Olga Millers, MD Electronically Signed: By: Olga Millers M.D. On: 11/17/2022 09:28    Assessment & Plan:   Problem List Items Addressed This Visit       Cardiovascular and Mediastinum   Coronary atherosclerosis    Coronary calcium CT score is 11.5.  Fish oil and aspirin - discussed        Endocrine   Type 2 diabetes mellitus (HCC) - Primary    Will add Mounjaro Discussed      Relevant Medications   tirzepatide (MOUNJARO) 2.5 MG/0.5ML Pen   Other Relevant Orders   Hemoglobin A1c   Comprehensive metabolic panel     Other   Elevated BP without diagnosis of hypertension    Cont w/wt loss         Meds ordered this encounter  Medications   tirzepatide (MOUNJARO) 2.5 MG/0.5ML Pen    Sig: Inject 2.5 mg into the skin once a week.    Dispense:  2 mL    Refill:  3      Follow-up: Return in about 3 months (around 02/25/2023) for a follow-up visit.  Sonda Primes, MD

## 2023-01-01 ENCOUNTER — Telehealth: Payer: Self-pay | Admitting: Internal Medicine

## 2023-01-01 NOTE — Telephone Encounter (Signed)
Patient called and said John Kaiser was denied by insurance. The insurance company said they need doctor approval to possibly continue it. The patient would like to know if Dr. Alain Marion  would like to prescribe something else or has other ideas. Best callback for patient is [phone].

## 2023-01-04 NOTE — Telephone Encounter (Signed)
If MD want to appeal. Will need letter...[name]

## 2023-01-06 NOTE — Telephone Encounter (Signed)
Called pt he states he just received a letter no alternatives was on letter. Pt will contact insurance to see what alternatives they would cover and call us back w/ Names.John KitchenJohny Chess

## 2023-01-06 NOTE — Telephone Encounter (Signed)
What do they cover instead of Mounjaro?  Thanks

## 2023-01-07 NOTE — Telephone Encounter (Signed)
Pt call back he states the only name alternative is the Ozempic. Pt states would like to try inform will send msg to MD once he send to [facility] will call him back w/ status.Marland KitchenJohny Chess

## 2023-01-08 MED ORDER — OZEMPIC (0.25 OR 0.5 MG/DOSE) 2 MG/3ML ~~LOC~~ SOPN: PEN_INJECTOR | SUBCUTANEOUS | 1 refills | Status: DC

## 2023-01-08 NOTE — Telephone Encounter (Signed)
Okay. Thank you.

## 2023-01-08 NOTE — Telephone Encounter (Signed)
Notified pt MD sent Ozempic to [facility].Marland KitchenJohny Kaiser

## 2023-01-28 ENCOUNTER — Telehealth: Payer: Self-pay | Admitting: Internal Medicine

## 2023-01-28 NOTE — Telephone Encounter (Signed)
[employer] called stating the medication needs Prior Authorization. Pt is completely out of hid medication.

## 2023-02-01 ENCOUNTER — Other Ambulatory Visit (HOSPITAL_COMMUNITY): Payer: Self-pay

## 2023-02-01 ENCOUNTER — Telehealth: Payer: Self-pay

## 2023-02-01 NOTE — Telephone Encounter (Signed)
Pharmacy Patient Advocate Encounter   Received notification that prior authorization for Ozempic  is required/requested.  Per Test Claim: Prior authorization required   PA submitted on 02/01/23 to (ins) [employer] via CoverMyMeds Key or (Medicaid) confirmation # [id number] Status is pending

## 2023-02-03 NOTE — Telephone Encounter (Signed)
Patient Advocate Encounter  Prior Authorization for Ozempic has been approved.    PA# [id number] Effective dates: through 02/01/24   Approval letter attached to chart

## 2023-02-24 ENCOUNTER — Ambulatory Visit (INDEPENDENT_AMBULATORY_CARE_PROVIDER_SITE_OTHER): Payer: Medicaid Other | Admitting: Internal Medicine

## 2023-02-24 VITALS — BP 128/82 | HR 67 | Temp 98.5°F | Ht 72.0 in | Wt 236.6 lb

## 2023-02-24 DIAGNOSIS — K760 Fatty (change of) liver, not elsewhere classified: Secondary | ICD-10-CM | POA: Diagnosis not present

## 2023-02-24 DIAGNOSIS — E1165 Type 2 diabetes mellitus with hyperglycemia: Secondary | ICD-10-CM | POA: Diagnosis not present

## 2023-02-24 DIAGNOSIS — Z7985 Long-term (current) use of injectable non-insulin antidiabetic drugs: Secondary | ICD-10-CM

## 2023-02-24 DIAGNOSIS — Z7984 Long term (current) use of oral hypoglycemic drugs: Secondary | ICD-10-CM

## 2023-02-24 DIAGNOSIS — E785 Hyperlipidemia, unspecified: Secondary | ICD-10-CM

## 2023-02-24 LAB — COMPREHENSIVE METABOLIC PANEL
ALT: 22 U/L (ref 0–53)
AST: 15 U/L (ref 0–37)
Albumin: 4.4 g/dL (ref 3.5–5.2)
Alkaline Phosphatase: 35 U/L — ABNORMAL LOW (ref 39–117)
BUN: 15 mg/dL (ref 6–23)
CO2: 28 mEq/L (ref 19–32)
Calcium: 9.9 mg/dL (ref 8.4–10.5)
Chloride: 101 mEq/L (ref 96–112)
Creatinine, Ser: 1.07 mg/dL (ref 0.40–1.50)
GFR: 76.52 mL/min (ref >60.00)
Glucose, Bld: 171 mg/dL — ABNORMAL HIGH (ref 70–99)
Potassium: 4.5 mEq/L (ref 3.5–5.1)
Sodium: 138 mEq/L (ref 135–145)
Total Bilirubin: 0.5 mg/dL (ref 0.2–1.2)
Total Protein: 7.9 g/dL (ref 6.0–8.3)

## 2023-02-24 LAB — HEMOGLOBIN A1C: Hgb A1c MFr Bld: 7.3 % — ABNORMAL HIGH (ref 4.6–6.5)

## 2023-02-24 MED ORDER — OZEMPIC (1 MG/DOSE) 2 MG/1.5ML ~~LOC~~ SOPN: 1.0000 mg | PEN_INJECTOR | SUBCUTANEOUS | 3 refills | Status: DC

## 2023-02-24 NOTE — Assessment & Plan Note (Addendum)
Start repaglinide and low-dose metformin.  Hydrate well.  Glucometer.  Diet and exercise.  11/2022 coronary calcium CT score is 11.5.  Fish oil and aspirin Started Ozempic 3 wks ago. Titrate up.

## 2023-02-24 NOTE — Assessment & Plan Note (Signed)
Fish oil and Ozempic

## 2023-02-24 NOTE — Assessment & Plan Note (Signed)
On Fish oil and aspirin

## 2023-02-24 NOTE — Progress Notes (Signed)
Subjective:  Patient ID: John Kaiser, male    DOB: Mar 11, 1966  Age: 57 y.o. MRN: [id number]  CC: No chief complaint on file.   HPI John Kaiser presents for DM, fatty liver, CAD Mom is sick  Outpatient Medications Prior to Visit  Medication Sig Dispense Refill   Accu-Chek Softclix Lancets lancets Use as instructed 100 each 12   aspirin 81 MG chewable tablet      Blood Glucose Monitoring Suppl (ACCU-CHEK AVIVA PLUS) w/Device KIT As directed 1 kit 0   Cholecalciferol (VITAMIN D3) 50 MCG (2000 UT) capsule Take 2,000 Units by mouth daily.     ciclopirox (PENLAC) 8 % solution Apply topically at bedtime. Apply over nail and surrounding skin daily 6.6 mL 1   glucose blood (ACCU-CHEK AVIVA PLUS) test strip Use as instructed twice daily 100 each 12   LACTOBACILLUS PO Take by mouth as needed.     metFORMIN (GLUCOPHAGE) 500 MG tablet Take 1 tablet (500 mg total) by mouth daily with breakfast. 90 tablet 3   Multiple Vitamin (MULTI-VITAMIN) tablet Take 1 tablet by mouth daily.     repaglinide (PRANDIN) 1 MG tablet Take 1 tablet (1 mg total) by mouth 3 (three) times daily before meals. 90 tablet 5   Semaglutide,0.25 or 0.5MG /DOS, (OZEMPIC, 0.25 OR 0.5 MG/DOSE,) 2 MG/3ML SOPN Use 0.25 mg weekly sq for 1 month, then 0.5 mg sq weekly 3 mL 1   azithromycin (ZITHROMAX Z-PAK) 250 MG tablet As directed (Patient not taking: Reported on 02/24/2023) 6 tablet 0   clotrimazole-betamethasone (LOTRISONE) cream Apply 1 Application topically 2 (two) times daily. (Patient not taking: Reported on 02/24/2023) 30 g 1   No facility-administered medications prior to visit.    ROS: Review of Systems  Constitutional:  Negative for appetite change, fatigue and unexpected weight change.  HENT:  Negative for congestion, nosebleeds, sneezing, sore throat and trouble swallowing.   Eyes:  Negative for itching and visual disturbance.  Respiratory:  Negative for cough.   Cardiovascular:  Negative for chest pain, palpitations  and leg swelling.  Gastrointestinal:  Negative for abdominal distention, blood in stool, diarrhea and nausea.  Genitourinary:  Negative for frequency and hematuria.  Musculoskeletal:  Negative for back pain, gait problem, joint swelling and neck pain.  Skin:  Negative for rash.  Neurological:  Negative for dizziness, tremors, speech difficulty and weakness.  Psychiatric/Behavioral:  Negative for agitation, dysphoric mood and sleep disturbance. The patient is nervous/anxious.     Objective:  BP 128/82 (BP Location: Left Arm, Patient Position: Sitting, Cuff Size: Normal)   Pulse 67   Temp 98.5 F (36.9 C) (Oral)   Ht 6' (1.829 m)   Wt 236 lb 9.6 oz (107.3 kg)   SpO2 97%   BMI 32.09 kg/m   BP Readings from Last 3 Encounters:  02/24/23 128/82  11/26/22 122/78  11/12/22 132/80    Wt Readings from Last 3 Encounters:  02/24/23 236 lb 9.6 oz (107.3 kg)  11/26/22 231 lb (104.8 kg)  11/12/22 230 lb (104.3 kg)    Physical Exam Constitutional:      General: He is not in acute distress.    Appearance: He is well-developed. He is obese.     Comments: NAD  Eyes:     Conjunctiva/sclera: Conjunctivae normal.     Pupils: Pupils are equal, round, and reactive to light.  Neck:     Thyroid: No thyromegaly.     Vascular: No JVD.  Cardiovascular:  Rate and Rhythm: Normal rate and regular rhythm.     Heart sounds: Normal heart sounds. No murmur heard.    No friction rub. No gallop.  Pulmonary:     Effort: Pulmonary effort is normal. No respiratory distress.     Breath sounds: Normal breath sounds. No wheezing or rales.  Chest:     Chest wall: No tenderness.  Abdominal:     General: Bowel sounds are normal. There is no distension.     Palpations: Abdomen is soft. There is no mass.     Tenderness: There is no abdominal tenderness. There is no guarding or rebound.  Musculoskeletal:        General: No tenderness. Normal range of motion.     Cervical back: Normal range of motion.   Lymphadenopathy:     Cervical: No cervical adenopathy.  Skin:    General: Skin is warm and dry.     Findings: No rash.  Neurological:     Mental Status: He is alert and oriented to person, place, and time.     Cranial Nerves: No cranial nerve deficit.     Motor: No abnormal muscle tone.     Coordination: Coordination normal.     Gait: Gait normal.     Deep Tendon Reflexes: Reflexes are normal and symmetric.  Psychiatric:        Behavior: Behavior normal.        Thought Content: Thought content normal.        Judgment: Judgment normal.     Lab Results  Component Value Date   WBC 8.1 11/12/2022   HGB 16.5 11/12/2022   HCT 47.6 11/12/2022   PLT 258.0 11/12/2022   GLUCOSE 171 (H) 02/24/2023   CHOL 282 (H) 11/12/2022   TRIG 331.0 (H) 11/12/2022   HDL 37.90 (L) 11/12/2022   LDLDIRECT 196.0 11/12/2022   ALT 22 02/24/2023   AST 15 02/24/2023   NA 138 02/24/2023   K 4.5 02/24/2023   CL 101 02/24/2023   CREATININE 1.07 02/24/2023   BUN 15 02/24/2023   CO2 28 02/24/2023   TSH 1.20 11/12/2022   PSA 2.78 11/12/2022   HGBA1C 7.3 (H) 02/24/2023    CT CARDIAC SCORING (SELF PAY ONLY)  Addendum Date: 11/17/2022   ADDENDUM REPORT: 11/17/2022 10:29 EXAM: OVER-READ INTERPRETATION  CT CHEST The following report is a limited chest CT over-read performed by radiologist Dr. Irma Newness [facility] on 11/17/2022. This over-read does not include interpretation of cardiac or coronary anatomy or pathology. The coronary calcium score interpretation by the cardiologist is attached. COMPARISON:  None. FINDINGS: Mediastinum/Nodes: No enlarged lymph nodes within the visualized mediastinum. Lungs/Pleura: There is no pleural effusion. Small calcified granuloma in the lingula. The visualized lungs are otherwise clear. Upper abdomen: Probable hepatic steatosis. Musculoskeletal/Chest wall: No chest wall mass or suspicious osseous findings within the visualized chest. Mild thoracic  spondylosis. IMPRESSION: 1. No significant extracardiac findings within the visualized lower chest. 2. Probable hepatic steatosis. Electronically Signed   By: Carey Bullocks M.D.   On: 11/17/2022 10:29   Result Date: 11/17/2022 CLINICAL DATA:  Cardiovascular Disease Risk stratification EXAM: Coronary Calcium Score TECHNIQUE: A gated, non-contrast computed tomography scan of the heart was performed using 3mm slice thickness. Axial images were analyzed on a dedicated workstation. Calcium scoring of the coronary arteries was performed using the Agatston method. FINDINGS: Coronary Calcium Score: Left main: 0 Left anterior descending artery: 5.19 Left circumflex artery: 6.71 Right coronary artery: 0 Total: 11.9  Percentile: 46 Pericardium: Normal. Ascending Aorta: Normal caliber.  Mild aortic atherosclerosis. Non-cardiac: See separate report from [facility]. IMPRESSION: Coronary calcium score of 11.9. This was 60 percentile for age-, race-, and sex-matched controls. Mild aortic atherosclerosis. RECOMMENDATIONS: Coronary artery calcium (CAC) score is a strong predictor of incident coronary heart disease (CHD) and provides predictive information beyond traditional risk factors. CAC scoring is reasonable to use in the decision to withhold, postpone, or initiate statin therapy in intermediate-risk or selected borderline-risk asymptomatic adults (age 74-75 years and LDL-C >=70 to <190 mg/dL) who do not have diabetes or established atherosclerotic cardiovascular disease (ASCVD).* In intermediate-risk (10-year ASCVD risk >=7.5% to <20%) adults or selected borderline-risk (10-year ASCVD risk >=5% to <7.5%) adults in whom a CAC score is measured for the purpose of making a treatment decision the following recommendations have been made: If CAC=0, it is reasonable to withhold statin therapy and reassess in 5 to 10 years, as long as higher risk conditions are absent (diabetes mellitus, family history of premature CHD in  first degree relatives (males <55 years; females <65 years), cigarette smoking, or LDL >=190 mg/dL). If CAC is 1 to 99, it is reasonable to initiate statin therapy for patients >=68 years of age. If CAC is >=100 or >=75th percentile, it is reasonable to initiate statin therapy at any age. Cardiology referral should be considered for patients with CAC scores >=400 or >=75th percentile. *2018 AHA/ACC/AACVPR/AAPA/ABC/ACPM/ADA/AGS/APhA/ASPC/NLA/PCNA Guideline on the Management of Blood Cholesterol: A Report of the American College of Cardiology/American Heart Association Task Force on Clinical Practice Guidelines. J Am Coll Cardiol. 2019;73(24):3168-3209. Olga Millers, MD Electronically Signed: By: Olga Millers M.D. On: 11/17/2022 09:28    Assessment & Plan:   Problem List Items Addressed This Visit     Dyslipidemia    On Fish oil and aspirin      Type 2 diabetes mellitus (HCC) - Primary    Start repaglinide and low-dose metformin.  Hydrate well.  Glucometer.  Diet and exercise.  11/2022 coronary calcium CT score is 11.5.  Fish oil and aspirin Started Ozempic 3 wks ago. Titrate up.      Relevant Medications   Semaglutide, 1 MG/DOSE, (OZEMPIC, 1 MG/DOSE,) 2 MG/1.5ML SOPN   Fatty liver    Fish oil and Ozempic         Meds ordered this encounter  Medications   Semaglutide, 1 MG/DOSE, (OZEMPIC, 1 MG/DOSE,) 2 MG/1.5ML SOPN    Sig: Inject 1 mg into the skin once a week. Use 1 mg sq weekly. Can titrate up to 2 mg weekly if tolerated    Dispense:  1.5 mL    Refill:  3      Follow-up: Return in about 3 months (around 05/26/2023) for a follow-up visit.  Sonda Primes, MD

## 2023-03-06 ENCOUNTER — Encounter: Payer: Self-pay | Admitting: Internal Medicine

## 2023-05-26 ENCOUNTER — Encounter: Payer: Self-pay | Admitting: Internal Medicine

## 2023-05-26 ENCOUNTER — Ambulatory Visit (INDEPENDENT_AMBULATORY_CARE_PROVIDER_SITE_OTHER): Payer: Medicaid Other | Admitting: Internal Medicine

## 2023-05-26 VITALS — BP 120/80 | HR 53 | Temp 98.0°F | Ht 72.0 in | Wt 243.0 lb

## 2023-05-26 DIAGNOSIS — I2583 Coronary atherosclerosis due to lipid rich plaque: Secondary | ICD-10-CM

## 2023-05-26 DIAGNOSIS — E1165 Type 2 diabetes mellitus with hyperglycemia: Secondary | ICD-10-CM

## 2023-05-26 DIAGNOSIS — Z7985 Long-term (current) use of injectable non-insulin antidiabetic drugs: Secondary | ICD-10-CM | POA: Diagnosis not present

## 2023-05-26 DIAGNOSIS — I2510 Atherosclerotic heart disease of native coronary artery without angina pectoris: Secondary | ICD-10-CM | POA: Diagnosis not present

## 2023-05-26 DIAGNOSIS — Z8249 Family history of ischemic heart disease and other diseases of the circulatory system: Secondary | ICD-10-CM

## 2023-05-26 LAB — COMPREHENSIVE METABOLIC PANEL
ALT: 20 U/L (ref 0–53)
AST: 14 U/L (ref 0–37)
Albumin: 4.4 g/dL (ref 3.5–5.2)
Alkaline Phosphatase: 36 U/L — ABNORMAL LOW (ref 39–117)
BUN: 15 mg/dL (ref 6–23)
CO2: 27 mEq/L (ref 19–32)
Calcium: 9.9 mg/dL (ref 8.4–10.5)
Chloride: 103 mEq/L (ref 96–112)
Creatinine, Ser: 1.01 mg/dL (ref 0.40–1.50)
GFR: 81.86 mL/min (ref >60.00)
Glucose, Bld: 178 mg/dL — ABNORMAL HIGH (ref 70–99)
Potassium: 4.9 mEq/L (ref 3.5–5.1)
Sodium: 138 mEq/L (ref 135–145)
Total Bilirubin: 0.5 mg/dL (ref 0.2–1.2)
Total Protein: 7.7 g/dL (ref 6.0–8.3)

## 2023-05-26 LAB — HEMOGLOBIN A1C: Hgb A1c MFr Bld: 7.4 % — ABNORMAL HIGH (ref 4.6–6.5)

## 2023-05-26 MED ORDER — OZEMPIC (1 MG/DOSE) 2 MG/1.5ML ~~LOC~~ SOPN: 1.0000 mg | PEN_INJECTOR | SUBCUTANEOUS | 3 refills | Status: DC

## 2023-05-26 NOTE — Assessment & Plan Note (Signed)
CT cardiac scoring or Card ref offered

## 2023-05-26 NOTE — Assessment & Plan Note (Signed)
Coronary calcium CT score is 11.5.  Fish oil and aspirin - discussed

## 2023-05-26 NOTE — Progress Notes (Signed)
Subjective:  Patient ID: John Kaiser, male    DOB: Jan 09, 1966  Age: 57 y.o. MRN: [id number]  CC: Follow-up (3 mnth f/u/)   HPI EBB DAVIDHEISER presents for DM, CAD C/o wt gain  Outpatient Medications Prior to Visit  Medication Sig Dispense Refill   Accu-Chek Softclix Lancets lancets Use as instructed 100 each 12   aspirin 81 MG chewable tablet      Blood Glucose Monitoring Suppl (ACCU-CHEK AVIVA PLUS) w/Device KIT As directed 1 kit 0   Cholecalciferol (VITAMIN D3) 50 MCG (2000 UT) capsule Take 2,000 Units by mouth daily.     ciclopirox (PENLAC) 8 % solution Apply topically at bedtime. Apply over nail and surrounding skin daily 6.6 mL 1   glucose blood (ACCU-CHEK AVIVA PLUS) test strip Use as instructed twice daily 100 each 12   LACTOBACILLUS PO Take by mouth as needed.     metFORMIN (GLUCOPHAGE) 500 MG tablet Take 1 tablet (500 mg total) by mouth daily with breakfast. 90 tablet 3   Multiple Vitamin (MULTI-VITAMIN) tablet Take 1 tablet by mouth daily.     repaglinide (PRANDIN) 1 MG tablet Take 1 tablet (1 mg total) by mouth 3 (three) times daily before meals. 90 tablet 5   Semaglutide, 1 MG/DOSE, (OZEMPIC, 1 MG/DOSE,) 2 MG/1.5ML SOPN Inject 1 mg into the skin once a week. Use 1 mg sq weekly. Can titrate up to 2 mg weekly if tolerated 1.5 mL 3   azithromycin (ZITHROMAX Z-PAK) 250 MG tablet As directed (Patient not taking: Reported on 02/24/2023) 6 tablet 0   clotrimazole-betamethasone (LOTRISONE) cream Apply 1 Application topically 2 (two) times daily. (Patient not taking: Reported on 02/24/2023) 30 g 1   No facility-administered medications prior to visit.    ROS: Review of Systems  Constitutional:  Positive for unexpected weight change. Negative for appetite change and fatigue.  HENT:  Negative for congestion, nosebleeds, sneezing, sore throat and trouble swallowing.   Eyes:  Negative for itching and visual disturbance.  Respiratory:  Negative for cough.   Cardiovascular:  Negative  for chest pain, palpitations and leg swelling.  Gastrointestinal:  Negative for abdominal distention, blood in stool, diarrhea and nausea.  Genitourinary:  Negative for frequency and hematuria.  Musculoskeletal:  Negative for back pain, gait problem, joint swelling and neck pain.  Skin:  Negative for rash.  Neurological:  Negative for dizziness, tremors, speech difficulty and weakness.  Psychiatric/Behavioral:  Negative for agitation, dysphoric mood and sleep disturbance. The patient is not nervous/anxious.     Objective:  BP 120/80 (BP Location: Left Arm, Patient Position: Sitting, Cuff Size: Large)   Pulse (!) 53   Temp 98 F (36.7 C) (Oral)   Ht 6' (1.829 m)   Wt 243 lb (110.2 kg)   SpO2 97%   BMI 32.96 kg/m   BP Readings from Last 3 Encounters:  05/26/23 120/80  02/24/23 128/82  11/26/22 122/78    Wt Readings from Last 3 Encounters:  05/26/23 243 lb (110.2 kg)  02/24/23 236 lb 9.6 oz (107.3 kg)  11/26/22 231 lb (104.8 kg)    Physical Exam Constitutional:      General: He is not in acute distress.    Appearance: He is well-developed. He is obese.     Comments: NAD  Eyes:     Conjunctiva/sclera: Conjunctivae normal.     Pupils: Pupils are equal, round, and reactive to light.  Neck:     Thyroid: No thyromegaly.     Vascular:  No JVD.  Cardiovascular:     Rate and Rhythm: Normal rate and regular rhythm.     Heart sounds: Normal heart sounds. No murmur heard.    No friction rub. No gallop.  Pulmonary:     Effort: Pulmonary effort is normal. No respiratory distress.     Breath sounds: Normal breath sounds. No wheezing or rales.  Chest:     Chest wall: No tenderness.  Abdominal:     General: Bowel sounds are normal. There is no distension.     Palpations: Abdomen is soft. There is no mass.     Tenderness: There is no abdominal tenderness. There is no guarding or rebound.  Musculoskeletal:        General: No tenderness. Normal range of motion.     Cervical back:  Normal range of motion.  Lymphadenopathy:     Cervical: No cervical adenopathy.  Skin:    General: Skin is warm and dry.     Findings: No rash.  Neurological:     Mental Status: He is alert and oriented to person, place, and time.     Cranial Nerves: No cranial nerve deficit.     Motor: No abnormal muscle tone.     Coordination: Coordination normal.     Gait: Gait normal.     Deep Tendon Reflexes: Reflexes are normal and symmetric.  Psychiatric:        Behavior: Behavior normal.        Thought Content: Thought content normal.        Judgment: Judgment normal.     Lab Results  Component Value Date   WBC 8.1 11/12/2022   HGB 16.5 11/12/2022   HCT 47.6 11/12/2022   PLT 258.0 11/12/2022   GLUCOSE 171 (H) 02/24/2023   CHOL 282 (H) 11/12/2022   TRIG 331.0 (H) 11/12/2022   HDL 37.90 (L) 11/12/2022   LDLDIRECT 196.0 11/12/2022   ALT 22 02/24/2023   AST 15 02/24/2023   NA 138 02/24/2023   K 4.5 02/24/2023   CL 101 02/24/2023   CREATININE 1.07 02/24/2023   BUN 15 02/24/2023   CO2 28 02/24/2023   TSH 1.20 11/12/2022   PSA 2.78 11/12/2022   HGBA1C 7.3 (H) 02/24/2023    CT CARDIAC SCORING (SELF PAY ONLY)  Addendum Date: 11/17/2022   ADDENDUM REPORT: 11/17/2022 10:29 EXAM: OVER-READ INTERPRETATION  CT CHEST The following report is a limited chest CT over-read performed by radiologist Dr. Irma Newness [facility] on 11/17/2022. This over-read does not include interpretation of cardiac or coronary anatomy or pathology. The coronary calcium score interpretation by the cardiologist is attached. COMPARISON:  None. FINDINGS: Mediastinum/Nodes: No enlarged lymph nodes within the visualized mediastinum. Lungs/Pleura: There is no pleural effusion. Small calcified granuloma in the lingula. The visualized lungs are otherwise clear. Upper abdomen: Probable hepatic steatosis. Musculoskeletal/Chest wall: No chest wall mass or suspicious osseous findings within the visualized chest.  Mild thoracic spondylosis. IMPRESSION: 1. No significant extracardiac findings within the visualized lower chest. 2. Probable hepatic steatosis. Electronically Signed   By: Carey Bullocks M.D.   On: 11/17/2022 10:29   Result Date: 11/17/2022 CLINICAL DATA:  Cardiovascular Disease Risk stratification EXAM: Coronary Calcium Score TECHNIQUE: A gated, non-contrast computed tomography scan of the heart was performed using 3mm slice thickness. Axial images were analyzed on a dedicated workstation. Calcium scoring of the coronary arteries was performed using the Agatston method. FINDINGS: Coronary Calcium Score: Left main: 0 Left anterior descending artery: 5.19 Left circumflex  artery: 6.71 Right coronary artery: 0 Total: 11.9 Percentile: 46 Pericardium: Normal. Ascending Aorta: Normal caliber.  Mild aortic atherosclerosis. Non-cardiac: See separate report from [facility]. IMPRESSION: Coronary calcium score of 11.9. This was 57 percentile for age-, race-, and sex-matched controls. Mild aortic atherosclerosis. RECOMMENDATIONS: Coronary artery calcium (CAC) score is a strong predictor of incident coronary heart disease (CHD) and provides predictive information beyond traditional risk factors. CAC scoring is reasonable to use in the decision to withhold, postpone, or initiate statin therapy in intermediate-risk or selected borderline-risk asymptomatic adults (age 20-75 years and LDL-C >=70 to <190 mg/dL) who do not have diabetes or established atherosclerotic cardiovascular disease (ASCVD).* In intermediate-risk (10-year ASCVD risk >=7.5% to <20%) adults or selected borderline-risk (10-year ASCVD risk >=5% to <7.5%) adults in whom a CAC score is measured for the purpose of making a treatment decision the following recommendations have been made: If CAC=0, it is reasonable to withhold statin therapy and reassess in 5 to 10 years, as long as higher risk conditions are absent (diabetes mellitus, family history of  premature CHD in first degree relatives (males <55 years; females <65 years), cigarette smoking, or LDL >=190 mg/dL). If CAC is 1 to 99, it is reasonable to initiate statin therapy for patients >=69 years of age. If CAC is >=100 or >=75th percentile, it is reasonable to initiate statin therapy at any age. Cardiology referral should be considered for patients with CAC scores >=400 or >=75th percentile. *2018 AHA/ACC/AACVPR/AAPA/ABC/ACPM/ADA/AGS/APhA/ASPC/NLA/PCNA Guideline on the Management of Blood Cholesterol: A Report of the American College of Cardiology/American Heart Association Task Force on Clinical Practice Guidelines. J Am Coll Cardiol. 2019;73(24):3168-3209. Olga Millers, MD Electronically Signed: By: Olga Millers M.D. On: 11/17/2022 09:28    Assessment & Plan:   Problem List Items Addressed This Visit     Family history of early CAD    CT cardiac scoring or Card ref offered      Type 2 diabetes mellitus (HCC) - Primary    On Ozempic 1 mg/d Gained wt on Vacation      Relevant Medications   Semaglutide, 1 MG/DOSE, (OZEMPIC, 1 MG/DOSE,) 2 MG/1.5ML SOPN   Coronary atherosclerosis    Coronary calcium CT score is 11.5.  Fish oil and aspirin - discussed         Meds ordered this encounter  Medications   Semaglutide, 1 MG/DOSE, (OZEMPIC, 1 MG/DOSE,) 2 MG/1.5ML SOPN    Sig: Inject 1 mg into the skin once a week. Use 1 mg sq weekly.    Dispense:  1.5 mL    Refill:  3      Follow-up: Return in about 3 months (around 08/26/2023) for f/u with PCP.  Sonda Primes, MD

## 2023-05-26 NOTE — Assessment & Plan Note (Signed)
On Ozempic 1 mg/d Gained wt on Vacation

## 2023-08-02 ENCOUNTER — Other Ambulatory Visit: Payer: Self-pay | Admitting: Internal Medicine

## 2023-08-06 ENCOUNTER — Other Ambulatory Visit: Payer: Self-pay | Admitting: Internal Medicine

## 2023-08-06 DIAGNOSIS — Z1212 Encounter for screening for malignant neoplasm of rectum: Secondary | ICD-10-CM

## 2023-08-06 DIAGNOSIS — Z1211 Encounter for screening for malignant neoplasm of colon: Secondary | ICD-10-CM

## 2023-08-26 ENCOUNTER — Ambulatory Visit: Payer: Medicaid Other | Admitting: Internal Medicine

## 2023-08-26 ENCOUNTER — Encounter: Payer: Self-pay | Admitting: Internal Medicine

## 2023-08-26 VITALS — BP 130/82 | HR 64 | Temp 98.9°F | Wt 242.0 lb

## 2023-08-26 DIAGNOSIS — E785 Hyperlipidemia, unspecified: Secondary | ICD-10-CM | POA: Diagnosis not present

## 2023-08-26 DIAGNOSIS — E1165 Type 2 diabetes mellitus with hyperglycemia: Secondary | ICD-10-CM | POA: Diagnosis not present

## 2023-08-26 DIAGNOSIS — Z7985 Long-term (current) use of injectable non-insulin antidiabetic drugs: Secondary | ICD-10-CM

## 2023-08-26 DIAGNOSIS — Z8249 Family history of ischemic heart disease and other diseases of the circulatory system: Secondary | ICD-10-CM

## 2023-08-26 DIAGNOSIS — C4359 Malignant melanoma of other part of trunk: Secondary | ICD-10-CM | POA: Diagnosis not present

## 2023-08-26 LAB — COMPREHENSIVE METABOLIC PANEL
ALT: 22 U/L (ref 0–53)
AST: 18 U/L (ref 0–37)
Albumin: 4.5 g/dL (ref 3.5–5.2)
Alkaline Phosphatase: 39 U/L (ref 39–117)
BUN: 16 mg/dL (ref 6–23)
CO2: 26 meq/L (ref 19–32)
Calcium: 9.9 mg/dL (ref 8.4–10.5)
Chloride: 104 meq/L (ref 96–112)
Creatinine, Ser: 1.09 mg/dL (ref 0.40–1.50)
GFR: 74.57 mL/min (ref >60.00)
Glucose, Bld: 192 mg/dL — ABNORMAL HIGH (ref 70–99)
Potassium: 4.3 meq/L (ref 3.5–5.1)
Sodium: 137 meq/L (ref 135–145)
Total Bilirubin: 0.5 mg/dL (ref 0.2–1.2)
Total Protein: 7.6 g/dL (ref 6.0–8.3)

## 2023-08-26 LAB — HEMOGLOBIN A1C: Hgb A1c MFr Bld: 7.3 % — ABNORMAL HIGH (ref 4.6–6.5)

## 2023-08-26 MED ORDER — ONDANSETRON HCL 4 MG PO TABS: 4.0000 mg | ORAL_TABLET | Freq: Three times a day (TID) | ORAL | 1 refills | Status: DC | PRN

## 2023-08-26 MED ORDER — SEMAGLUTIDE (2 MG/DOSE) 8 MG/3ML ~~LOC~~ SOPN: 2.0000 mg | PEN_INJECTOR | SUBCUTANEOUS | 6 refills | Status: DC

## 2023-08-26 NOTE — Assessment & Plan Note (Signed)
F/u w/Derm  

## 2023-08-26 NOTE — Assessment & Plan Note (Signed)
Fish oil and aspirin

## 2023-08-26 NOTE — Assessment & Plan Note (Signed)
CT cardiac scoring or Card ref offered Coronary calcium CT score of 11.9

## 2023-08-26 NOTE — Progress Notes (Signed)
Subjective:  Patient ID: John Kaiser, male    DOB: 18-Sep-1966  Age: 57 y.o. MRN: [id number]  CC: Follow-up (3 MNTH F/U)   HPI RICHARDD JOYNER presents for DM,   Outpatient Medications Prior to Visit  Medication Sig Dispense Refill   Accu-Chek Softclix Lancets lancets Use as instructed 100 each 12   aspirin 81 MG chewable tablet      Blood Glucose Monitoring Suppl (ACCU-CHEK AVIVA PLUS) w/Device KIT As directed 1 kit 0   Cholecalciferol (VITAMIN D3) 50 MCG (2000 UT) capsule Take 2,000 Units by mouth daily.     ciclopirox (PENLAC) 8 % solution Apply topically at bedtime. Apply over nail and surrounding skin daily 6.6 mL 1   glucose blood (ACCU-CHEK AVIVA PLUS) test strip Use as instructed twice daily 100 each 12   LACTOBACILLUS PO Take by mouth as needed.     metFORMIN (GLUCOPHAGE) 500 MG tablet Take 1 tablet (500 mg total) by mouth daily with breakfast. 90 tablet 3   Multiple Vitamin (MULTI-VITAMIN) tablet Take 1 tablet by mouth daily.     repaglinide (PRANDIN) 1 MG tablet TAKE ONE TABLET BY MOUTH THREE TIMES A DAY BEFORE MEALS 90 tablet 5   Semaglutide, 1 MG/DOSE, (OZEMPIC, 1 MG/DOSE,) 2 MG/1.5ML SOPN Inject 1 mg into the skin once a week. Use 1 mg sq weekly. 1.5 mL 3   azithromycin (ZITHROMAX Z-PAK) 250 MG tablet As directed (Patient not taking: Reported on 02/24/2023) 6 tablet 0   clotrimazole-betamethasone (LOTRISONE) cream Apply 1 Application topically 2 (two) times daily. (Patient not taking: Reported on 02/24/2023) 30 g 1   No facility-administered medications prior to visit.    ROS: Review of Systems  Constitutional:  Negative for appetite change, fatigue and unexpected weight change.  HENT:  Negative for congestion, nosebleeds, sneezing, sore throat and trouble swallowing.   Eyes:  Negative for itching and visual disturbance.  Respiratory:  Negative for cough.   Cardiovascular:  Negative for chest pain, palpitations and leg swelling.  Gastrointestinal:  Positive for nausea.  Negative for abdominal distention, blood in stool and diarrhea.  Genitourinary:  Negative for frequency and hematuria.  Musculoskeletal:  Negative for back pain, gait problem, joint swelling and neck pain.  Skin:  Negative for rash.  Neurological:  Negative for dizziness, tremors, speech difficulty and weakness.  Psychiatric/Behavioral:  Negative for agitation, dysphoric mood and sleep disturbance. The patient is not nervous/anxious.     Objective:  BP 130/82 (BP Location: Right Arm, Patient Position: Sitting, Cuff Size: Normal)   Pulse 64   Temp 98.9 F (37.2 C) (Oral)   Wt 242 lb (109.8 kg)   SpO2 97%   BMI 32.82 kg/m   BP Readings from Last 3 Encounters:  08/26/23 130/82  05/26/23 120/80  02/24/23 128/82    Wt Readings from Last 3 Encounters:  08/26/23 242 lb (109.8 kg)  05/26/23 243 lb (110.2 kg)  02/24/23 236 lb 9.6 oz (107.3 kg)    Physical Exam Constitutional:      General: He is not in acute distress.    Appearance: He is well-developed. He is obese.     Comments: NAD  Eyes:     Conjunctiva/sclera: Conjunctivae normal.     Pupils: Pupils are equal, round, and reactive to light.  Neck:     Thyroid: No thyromegaly.     Vascular: No JVD.  Cardiovascular:     Rate and Rhythm: Normal rate and regular rhythm.     Heart sounds:  Normal heart sounds. No murmur heard.    No friction rub. No gallop.  Pulmonary:     Effort: Pulmonary effort is normal. No respiratory distress.     Breath sounds: Normal breath sounds. No wheezing or rales.  Chest:     Chest wall: No tenderness.  Abdominal:     General: Bowel sounds are normal. There is no distension.     Palpations: Abdomen is soft. There is no mass.     Tenderness: There is no abdominal tenderness. There is no guarding or rebound.  Musculoskeletal:        General: No tenderness. Normal range of motion.     Cervical back: Normal range of motion.  Lymphadenopathy:     Cervical: No cervical adenopathy.  Skin:     General: Skin is warm and dry.     Findings: No rash.  Neurological:     Mental Status: He is alert and oriented to person, place, and time.     Cranial Nerves: No cranial nerve deficit.     Motor: No abnormal muscle tone.     Coordination: Coordination normal.     Gait: Gait normal.     Deep Tendon Reflexes: Reflexes are normal and symmetric.  Psychiatric:        Behavior: Behavior normal.        Thought Content: Thought content normal.        Judgment: Judgment normal.     Lab Results  Component Value Date   WBC 8.1 11/12/2022   HGB 16.5 11/12/2022   HCT 47.6 11/12/2022   PLT 258.0 11/12/2022   GLUCOSE 178 (H) 05/26/2023   CHOL 282 (H) 11/12/2022   TRIG 331.0 (H) 11/12/2022   HDL 37.90 (L) 11/12/2022   LDLDIRECT 196.0 11/12/2022   ALT 20 05/26/2023   AST 14 05/26/2023   NA 138 05/26/2023   K 4.9 05/26/2023   CL 103 05/26/2023   CREATININE 1.01 05/26/2023   BUN 15 05/26/2023   CO2 27 05/26/2023   TSH 1.20 11/12/2022   PSA 2.78 11/12/2022   HGBA1C 7.4 (H) 05/26/2023    CT CARDIAC SCORING (SELF PAY ONLY)  Addendum Date: 11/17/2022   ADDENDUM REPORT: 11/17/2022 10:29 EXAM: OVER-READ INTERPRETATION  CT CHEST The following report is a limited chest CT over-read performed by radiologist Dr. Irma Newness [facility] on 11/17/2022. This over-read does not include interpretation of cardiac or coronary anatomy or pathology. The coronary calcium score interpretation by the cardiologist is attached. COMPARISON:  None. FINDINGS: Mediastinum/Nodes: No enlarged lymph nodes within the visualized mediastinum. Lungs/Pleura: There is no pleural effusion. Small calcified granuloma in the lingula. The visualized lungs are otherwise clear. Upper abdomen: Probable hepatic steatosis. Musculoskeletal/Chest wall: No chest wall mass or suspicious osseous findings within the visualized chest. Mild thoracic spondylosis. IMPRESSION: 1. No significant extracardiac findings within the  visualized lower chest. 2. Probable hepatic steatosis. Electronically Signed   By: Carey Bullocks M.D.   On: 11/17/2022 10:29   Result Date: 11/17/2022 CLINICAL DATA:  Cardiovascular Disease Risk stratification EXAM: Coronary Calcium Score TECHNIQUE: A gated, non-contrast computed tomography scan of the heart was performed using 3mm slice thickness. Axial images were analyzed on a dedicated workstation. Calcium scoring of the coronary arteries was performed using the Agatston method. FINDINGS: Coronary Calcium Score: Left main: 0 Left anterior descending artery: 5.19 Left circumflex artery: 6.71 Right coronary artery: 0 Total: 11.9 Percentile: 46 Pericardium: Normal. Ascending Aorta: Normal caliber.  Mild aortic atherosclerosis. Non-cardiac: See  separate report from [facility]. IMPRESSION: Coronary calcium score of 11.9. This was 18 percentile for age-, race-, and sex-matched controls. Mild aortic atherosclerosis. RECOMMENDATIONS: Coronary artery calcium (CAC) score is a strong predictor of incident coronary heart disease (CHD) and provides predictive information beyond traditional risk factors. CAC scoring is reasonable to use in the decision to withhold, postpone, or initiate statin therapy in intermediate-risk or selected borderline-risk asymptomatic adults (age 62-75 years and LDL-C >=70 to <190 mg/dL) who do not have diabetes or established atherosclerotic cardiovascular disease (ASCVD).* In intermediate-risk (10-year ASCVD risk >=7.5% to <20%) adults or selected borderline-risk (10-year ASCVD risk >=5% to <7.5%) adults in whom a CAC score is measured for the purpose of making a treatment decision the following recommendations have been made: If CAC=0, it is reasonable to withhold statin therapy and reassess in 5 to 10 years, as long as higher risk conditions are absent (diabetes mellitus, family history of premature CHD in first degree relatives (males <55 years; females <65 years), cigarette  smoking, or LDL >=190 mg/dL). If CAC is 1 to 99, it is reasonable to initiate statin therapy for patients >=70 years of age. If CAC is >=100 or >=75th percentile, it is reasonable to initiate statin therapy at any age. Cardiology referral should be considered for patients with CAC scores >=400 or >=75th percentile. *2018 AHA/ACC/AACVPR/AAPA/ABC/ACPM/ADA/AGS/APhA/ASPC/NLA/PCNA Guideline on the Management of Blood Cholesterol: A Report of the American College of Cardiology/American Heart Association Task Force on Clinical Practice Guidelines. J Am Coll Cardiol. 2019;73(24):3168-3209. Olga Millers, MD Electronically Signed: By: Olga Millers M.D. On: 11/17/2022 09:28    Assessment & Plan:   Problem List Items Addressed This Visit     Dyslipidemia - Primary    Fish oil and aspirin      Type 2 diabetes mellitus (HCC)    On Ozempic 1 mg/d Will increase the dose to 2 mg/d Zofran prn      Relevant Medications   Semaglutide, 2 MG/DOSE, 8 MG/3ML SOPN      Meds ordered this encounter  Medications   Semaglutide, 2 MG/DOSE, 8 MG/3ML SOPN    Sig: Inject 2 mg as directed once a week.    Dispense:  3 mL    Refill:  6      Follow-up: Return in about 4 months (around 12/27/2023).  Sonda Primes, MD

## 2023-08-26 NOTE — Assessment & Plan Note (Signed)
On Ozempic 1 mg/d Will increase the dose to 2 mg/d Zofran prn

## 2023-12-07 ENCOUNTER — Other Ambulatory Visit: Payer: Self-pay | Admitting: Internal Medicine

## 2023-12-09 ENCOUNTER — Other Ambulatory Visit: Payer: Self-pay | Admitting: Internal Medicine

## 2023-12-30 ENCOUNTER — Encounter: Payer: Self-pay | Admitting: Internal Medicine

## 2023-12-30 ENCOUNTER — Ambulatory Visit (INDEPENDENT_AMBULATORY_CARE_PROVIDER_SITE_OTHER): Payer: Medicaid Other | Admitting: Internal Medicine

## 2023-12-30 VITALS — BP 140/90 | HR 54 | Temp 98.1°F | Ht 72.0 in | Wt 240.0 lb

## 2023-12-30 DIAGNOSIS — E669 Obesity, unspecified: Secondary | ICD-10-CM | POA: Diagnosis not present

## 2023-12-30 DIAGNOSIS — E785 Hyperlipidemia, unspecified: Secondary | ICD-10-CM | POA: Diagnosis not present

## 2023-12-30 DIAGNOSIS — K760 Fatty (change of) liver, not elsewhere classified: Secondary | ICD-10-CM | POA: Diagnosis not present

## 2023-12-30 DIAGNOSIS — E1165 Type 2 diabetes mellitus with hyperglycemia: Secondary | ICD-10-CM | POA: Diagnosis not present

## 2023-12-30 LAB — COMPREHENSIVE METABOLIC PANEL
ALT: 23 U/L (ref 0–53)
AST: 18 U/L (ref 0–37)
Albumin: 4.4 g/dL (ref 3.5–5.2)
Alkaline Phosphatase: 35 U/L — ABNORMAL LOW (ref 39–117)
BUN: 19 mg/dL (ref 6–23)
CO2: 27 meq/L (ref 19–32)
Calcium: 9.5 mg/dL (ref 8.4–10.5)
Chloride: 102 meq/L (ref 96–112)
Creatinine, Ser: 1.18 mg/dL (ref 0.40–1.50)
GFR: 67.64 mL/min (ref >60.00)
Glucose, Bld: 165 mg/dL — ABNORMAL HIGH (ref 70–99)
Potassium: 4.5 meq/L (ref 3.5–5.1)
Sodium: 137 meq/L (ref 135–145)
Total Bilirubin: 0.6 mg/dL (ref 0.2–1.2)
Total Protein: 7.8 g/dL (ref 6.0–8.3)

## 2023-12-30 LAB — HEMOGLOBIN A1C: Hgb A1c MFr Bld: 7.2 % — ABNORMAL HIGH (ref 4.6–6.5)

## 2023-12-30 NOTE — Assessment & Plan Note (Signed)
 Ozempic is causing nausea, diarrhea all the time - he stopped

## 2023-12-30 NOTE — Assessment & Plan Note (Signed)
 Fish oil and aspirin

## 2023-12-30 NOTE — Assessment & Plan Note (Signed)
Nutrition consult

## 2023-12-30 NOTE — Progress Notes (Signed)
 Subjective:  Patient ID: John Kaiser, male    DOB: 07-26-1966  Age: 58 y.o. MRN: [id number]  CC: Follow-up (3 month follow up, diabetes)   HPI John Kaiser presents for DM, obesity. Ozempic is causing nausea, diarrhea all the time - he stopped Grieving  Outpatient Medications Prior to Visit  Medication Sig Dispense Refill   Accu-Chek Softclix Lancets lancets USE TO CHECK BLOOD GLUCOSE LEVELS TWICE DAILY AS DIRECTED BY MD 100 each 12   aspirin 81 MG chewable tablet      Blood Glucose Monitoring Suppl (ACCU-CHEK AVIVA PLUS) w/Device KIT As directed 1 kit 0   Cholecalciferol (VITAMIN D3) 50 MCG (2000 UT) capsule Take 2,000 Units by mouth daily.     ciclopirox (PENLAC) 8 % solution Apply topically at bedtime. Apply over nail and surrounding skin daily 6.6 mL 1   glucose blood (ACCU-CHEK GUIDE TEST) test strip USE TO CHECK BLOOD GLUCOSE TWICE DAILY AS DIRECTED 100 strip 6   LACTOBACILLUS PO Take by mouth as needed.     metFORMIN (GLUCOPHAGE) 500 MG tablet TAKE 1 TABLET BY MOUTH DAILY WITH BREAKFAST 90 tablet 3   Multiple Vitamin (MULTI-VITAMIN) tablet Take 1 tablet by mouth daily.     ondansetron (ZOFRAN) 4 MG tablet Take 1 tablet (4 mg total) by mouth every 8 (eight) hours as needed. 20 tablet 1   repaglinide (PRANDIN) 1 MG tablet TAKE ONE TABLET BY MOUTH THREE TIMES A DAY BEFORE MEALS 90 tablet 5   Semaglutide, 2 MG/DOSE, 8 MG/3ML SOPN Inject 2 mg as directed once a week. 3 mL 6   azithromycin (ZITHROMAX Z-PAK) 250 MG tablet As directed (Patient not taking: Reported on 12/30/2023) 6 tablet 0   No facility-administered medications prior to visit.    ROS: Review of Systems  Constitutional:  Negative for appetite change, fatigue and unexpected weight change.  HENT:  Negative for congestion, nosebleeds, sneezing, sore throat and trouble swallowing.   Eyes:  Negative for itching and visual disturbance.  Respiratory:  Negative for cough.   Cardiovascular:  Negative for chest pain,  palpitations and leg swelling.  Gastrointestinal:  Negative for abdominal distention, blood in stool, diarrhea and nausea.  Genitourinary:  Negative for frequency and hematuria.  Musculoskeletal:  Negative for back pain, gait problem, joint swelling and neck pain.  Skin:  Negative for rash.  Neurological:  Negative for dizziness, tremors, speech difficulty and weakness.  Psychiatric/Behavioral:  Negative for agitation, dysphoric mood and sleep disturbance. The patient is not nervous/anxious.     Objective:  BP (!) 140/90 (BP Location: Left Arm, Patient Position: Sitting, Cuff Size: Normal)   Pulse (!) 54   Temp 98.1 F (36.7 C) (Temporal)   Ht 6' (1.829 m)   Wt 240 lb (108.9 kg)   SpO2 99%   BMI 32.55 kg/m   BP Readings from Last 3 Encounters:  12/30/23 (!) 140/90  08/26/23 130/82  05/26/23 120/80    Wt Readings from Last 3 Encounters:  12/30/23 240 lb (108.9 kg)  08/26/23 242 lb (109.8 kg)  05/26/23 243 lb (110.2 kg)    Physical Exam Constitutional:      General: He is not in acute distress.    Appearance: He is well-developed. He is obese.     Comments: NAD  Eyes:     Conjunctiva/sclera: Conjunctivae normal.     Pupils: Pupils are equal, round, and reactive to light.  Neck:     Thyroid: No thyromegaly.     Vascular:  No JVD.  Cardiovascular:     Rate and Rhythm: Normal rate and regular rhythm.     Heart sounds: Normal heart sounds. No murmur heard.    No friction rub. No gallop.  Pulmonary:     Effort: Pulmonary effort is normal. No respiratory distress.     Breath sounds: Normal breath sounds. No wheezing or rales.  Chest:     Chest wall: No tenderness.  Abdominal:     General: Bowel sounds are normal. There is no distension.     Palpations: Abdomen is soft. There is no mass.     Tenderness: There is no abdominal tenderness. There is no guarding or rebound.  Musculoskeletal:        General: No tenderness. Normal range of motion.     Cervical back: Normal  range of motion.  Lymphadenopathy:     Cervical: No cervical adenopathy.  Skin:    General: Skin is warm and dry.     Findings: No rash.  Neurological:     Mental Status: He is alert and oriented to person, place, and time.     Cranial Nerves: No cranial nerve deficit.     Motor: No abnormal muscle tone.     Coordination: Coordination normal.     Gait: Gait normal.     Deep Tendon Reflexes: Reflexes are normal and symmetric.  Psychiatric:        Behavior: Behavior normal.        Thought Content: Thought content normal.        Judgment: Judgment normal.     Lab Results  Component Value Date   WBC 8.1 11/12/2022   HGB 16.5 11/12/2022   HCT 47.6 11/12/2022   PLT 258.0 11/12/2022   GLUCOSE 192 (H) 08/26/2023   CHOL 282 (H) 11/12/2022   TRIG 331.0 (H) 11/12/2022   HDL 37.90 (L) 11/12/2022   LDLDIRECT 196.0 11/12/2022   ALT 22 08/26/2023   AST 18 08/26/2023   NA 137 08/26/2023   K 4.3 08/26/2023   CL 104 08/26/2023   CREATININE 1.09 08/26/2023   BUN 16 08/26/2023   CO2 26 08/26/2023   TSH 1.20 11/12/2022   PSA 2.78 11/12/2022   HGBA1C 7.3 (H) 08/26/2023    CT CARDIAC SCORING (SELF PAY ONLY) Addendum Date: 11/17/2022 ADDENDUM REPORT: 11/17/2022 10:29 EXAM: OVER-READ INTERPRETATION  CT CHEST The following report is a limited chest CT over-read performed by radiologist Dr. Irma Newness [facility] on 11/17/2022. This over-read does not include interpretation of cardiac or coronary anatomy or pathology. The coronary calcium score interpretation by the cardiologist is attached. COMPARISON:  None. FINDINGS: Mediastinum/Nodes: No enlarged lymph nodes within the visualized mediastinum. Lungs/Pleura: There is no pleural effusion. Small calcified granuloma in the lingula. The visualized lungs are otherwise clear. Upper abdomen: Probable hepatic steatosis. Musculoskeletal/Chest wall: No chest wall mass or suspicious osseous findings within the visualized chest. Mild  thoracic spondylosis. IMPRESSION: 1. No significant extracardiac findings within the visualized lower chest. 2. Probable hepatic steatosis. Electronically Signed   By: Carey Bullocks M.D.   On: 11/17/2022 10:29   Result Date: 11/17/2022 CLINICAL DATA:  Cardiovascular Disease Risk stratification EXAM: Coronary Calcium Score TECHNIQUE: A gated, non-contrast computed tomography scan of the heart was performed using 3mm slice thickness. Axial images were analyzed on a dedicated workstation. Calcium scoring of the coronary arteries was performed using the Agatston method. FINDINGS: Coronary Calcium Score: Left main: 0 Left anterior descending artery: 5.19 Left circumflex artery: 6.71 Right  coronary artery: 0 Total: 11.9 Percentile: 46 Pericardium: Normal. Ascending Aorta: Normal caliber.  Mild aortic atherosclerosis. Non-cardiac: See separate report from [facility]. IMPRESSION: Coronary calcium score of 11.9. This was 31 percentile for age-, race-, and sex-matched controls. Mild aortic atherosclerosis. RECOMMENDATIONS: Coronary artery calcium (CAC) score is a strong predictor of incident coronary heart disease (CHD) and provides predictive information beyond traditional risk factors. CAC scoring is reasonable to use in the decision to withhold, postpone, or initiate statin therapy in intermediate-risk or selected borderline-risk asymptomatic adults (age 58-75 years and LDL-C >=70 to <190 mg/dL) who do not have diabetes or established atherosclerotic cardiovascular disease (ASCVD).* In intermediate-risk (10-year ASCVD risk >=7.5% to <20%) adults or selected borderline-risk (10-year ASCVD risk >=5% to <7.5%) adults in whom a CAC score is measured for the purpose of making a treatment decision the following recommendations have been made: If CAC=0, it is reasonable to withhold statin therapy and reassess in 5 to 10 years, as long as higher risk conditions are absent (diabetes mellitus, family history of  premature CHD in first degree relatives (males <55 years; females <65 years), cigarette smoking, or LDL >=190 mg/dL). If CAC is 1 to 99, it is reasonable to initiate statin therapy for patients >=11 years of age. If CAC is >=100 or >=75th percentile, it is reasonable to initiate statin therapy at any age. Cardiology referral should be considered for patients with CAC scores >=400 or >=75th percentile. *2018 AHA/ACC/AACVPR/AAPA/ABC/ACPM/ADA/AGS/APhA/ASPC/NLA/PCNA Guideline on the Management of Blood Cholesterol: A Report of the American College of Cardiology/American Heart Association Task Force on Clinical Practice Guidelines. J Am Coll Cardiol. 2019;73(24):3168-3209. Olga Millers, MD Electronically Signed: By: Olga Millers M.D. On: 11/17/2022 09:28    Assessment & Plan:   Problem List Items Addressed This Visit     Dyslipidemia   Fish oil and aspirin      Type 2 diabetes mellitus (HCC) - Primary   Ozempic is causing nausea, diarrhea all the time - he stopped      Relevant Orders   Comprehensive metabolic panel   Hemoglobin A1c   Amb ref to Medical Nutrition Therapy-MNT   Fatty liver   Relevant Orders   Amb ref to Medical Nutrition Therapy-MNT   Obesity (BMI 30-39.9)   Nutrition consult         No orders of the defined types were placed in this encounter.     Follow-up: Return in about 3 months (around 03/28/2024) for a follow-up visit.  Sonda Primes, MD

## 2024-01-04 ENCOUNTER — Encounter: Payer: Self-pay | Admitting: Internal Medicine

## 2024-01-05 ENCOUNTER — Telehealth: Payer: Self-pay | Admitting: Pharmacy Technician

## 2024-01-05 ENCOUNTER — Other Ambulatory Visit (HOSPITAL_COMMUNITY): Payer: Self-pay

## 2024-01-05 NOTE — Telephone Encounter (Signed)
 Pharmacy Patient Advocate Encounter   Received notification from CoverMyMeds that prior authorization for Ozempic is required/requested.   Insurance verification completed.   The patient is insured through [facility] .   Per test claim: The current 28 day co-pay is, $4.  No PA needed at this time. This test claim was processed through [facility]- copay amounts may vary at other pharmacies due to pharmacy/plan contracts, or as the patient moves through the different stages of their insurance plan.

## 2024-01-07 NOTE — Telephone Encounter (Signed)
 We stopped Ozempic due to nausea.  Thanks

## 2024-02-21 ENCOUNTER — Other Ambulatory Visit: Payer: Self-pay | Admitting: Internal Medicine

## 2024-03-28 ENCOUNTER — Ambulatory Visit (INDEPENDENT_AMBULATORY_CARE_PROVIDER_SITE_OTHER): Admitting: Internal Medicine

## 2024-03-28 ENCOUNTER — Encounter: Payer: Self-pay | Admitting: Internal Medicine

## 2024-03-28 VITALS — BP 138/78 | HR 71 | Temp 98.2°F | Ht 72.0 in | Wt 238.6 lb

## 2024-03-28 DIAGNOSIS — I2583 Coronary atherosclerosis due to lipid rich plaque: Secondary | ICD-10-CM | POA: Diagnosis not present

## 2024-03-28 DIAGNOSIS — M6530 Trigger finger, unspecified finger: Secondary | ICD-10-CM | POA: Diagnosis not present

## 2024-03-28 DIAGNOSIS — E1165 Type 2 diabetes mellitus with hyperglycemia: Secondary | ICD-10-CM

## 2024-03-28 MED ORDER — MELOXICAM 15 MG PO TABS: 15.0000 mg | ORAL_TABLET | Freq: Every day | ORAL | 1 refills | Status: DC

## 2024-03-28 NOTE — Progress Notes (Signed)
 Subjective:  Patient ID: John Kaiser, male    DOB: 03-18-1966  Age: 58 y.o. MRN: [id number]  CC: Medical Management of Chronic Issues (3 Month follow up. Developing trigger finger in left hand. Stiffness present in both hands especially in the morning. Also notes of some discomfort/dull pain in abdomen (bottom left quadrant). No other symptoms present)   HPI AZAREL BANNER presents for DM C/o trigger fingers w/pain   Outpatient Medications Prior to Visit  Medication Sig Dispense Refill   Accu-Chek Softclix Lancets lancets USE TO CHECK BLOOD GLUCOSE LEVELS TWICE DAILY AS DIRECTED BY MD 100 each 12   Blood Glucose Monitoring Suppl (ACCU-CHEK AVIVA PLUS) w/Device KIT As directed 1 kit 0   Cholecalciferol (VITAMIN D3) 50 MCG (2000 UT) capsule Take 2,000 Units by mouth daily.     ciclopirox  (PENLAC ) 8 % solution Apply topically at bedtime. Apply over nail and surrounding skin daily 6.6 mL 1   glucose blood (ACCU-CHEK GUIDE TEST) test strip USE TO CHECK BLOOD GLUCOSE TWICE DAILY AS DIRECTED 100 strip 6   LACTOBACILLUS PO Take by mouth as needed.     metFORMIN  (GLUCOPHAGE ) 500 MG tablet TAKE 1 TABLET BY MOUTH DAILY WITH BREAKFAST 90 tablet 3   Multiple Vitamin (MULTI-VITAMIN) tablet Take 1 tablet by mouth daily.     ondansetron  (ZOFRAN ) 4 MG tablet Take 1 tablet (4 mg total) by mouth every 8 (eight) hours as needed. 20 tablet 1   repaglinide  (PRANDIN ) 1 MG tablet TAKE 1 TABLET BY MOUTH 3 TIMES A DAY BEFORE MEALS 90 tablet 5   Semaglutide , 2 MG/DOSE, 8 MG/3ML SOPN Inject 2 mg as directed once a week. 3 mL 6   aspirin 81 MG chewable tablet      No facility-administered medications prior to visit.    ROS: Review of Systems  Constitutional:  Negative for appetite change, fatigue and unexpected weight change.  HENT:  Negative for congestion, nosebleeds, sneezing, sore throat and trouble swallowing.   Eyes:  Negative for itching and visual disturbance.  Respiratory:  Negative for cough.    Cardiovascular:  Negative for chest pain, palpitations and leg swelling.  Gastrointestinal:  Negative for abdominal distention, blood in stool, diarrhea and nausea.  Genitourinary:  Negative for frequency and hematuria.  Musculoskeletal:  Positive for arthralgias. Negative for back pain, gait problem, joint swelling and neck pain.  Skin:  Negative for rash.  Neurological:  Negative for dizziness, tremors, speech difficulty and weakness.  Psychiatric/Behavioral:  Negative for agitation, dysphoric mood and sleep disturbance. The patient is not nervous/anxious.     Objective:  BP 138/78   Pulse 71   Temp 98.2 F (36.8 C)   Ht 6' (1.829 m)   Wt 238 lb 9.6 oz (108.2 kg)   SpO2 96%   BMI 32.36 kg/m   BP Readings from Last 3 Encounters:  03/28/24 138/78  12/30/23 (!) 140/90  08/26/23 130/82    Wt Readings from Last 3 Encounters:  03/28/24 238 lb 9.6 oz (108.2 kg)  12/30/23 240 lb (108.9 kg)  08/26/23 242 lb (109.8 kg)    Physical Exam Constitutional:      General: He is not in acute distress.    Appearance: He is well-developed.     Comments: NAD  Eyes:     Conjunctiva/sclera: Conjunctivae normal.     Pupils: Pupils are equal, round, and reactive to light.  Neck:     Thyroid : No thyromegaly.     Vascular: No JVD.  Cardiovascular:     Rate and Rhythm: Normal rate and regular rhythm.     Heart sounds: Normal heart sounds. No murmur heard.    No friction rub. No gallop.  Pulmonary:     Effort: Pulmonary effort is normal. No respiratory distress.     Breath sounds: Normal breath sounds. No wheezing or rales.  Chest:     Chest wall: No tenderness.  Abdominal:     General: Bowel sounds are normal. There is no distension.     Palpations: Abdomen is soft. There is no mass.     Tenderness: There is no abdominal tenderness. There is no guarding or rebound.  Musculoskeletal:        General: No tenderness. Normal range of motion.     Cervical back: Normal range of motion.   Lymphadenopathy:     Cervical: No cervical adenopathy.  Skin:    General: Skin is warm and dry.     Findings: No rash.  Neurological:     Mental Status: He is alert and oriented to person, place, and time.     Cranial Nerves: No cranial nerve deficit.     Motor: No abnormal muscle tone.     Coordination: Coordination normal.     Gait: Gait normal.     Deep Tendon Reflexes: Reflexes are normal and symmetric.  Psychiatric:        Behavior: Behavior normal.        Thought Content: Thought content normal.        Judgment: Judgment normal.    L hand #3-4 triggering, R hand - stiffness #2-3   Lab Results  Component Value Date   WBC 8.1 11/12/2022   HGB 16.5 11/12/2022   HCT 47.6 11/12/2022   PLT 258.0 11/12/2022   GLUCOSE 165 (H) 12/30/2023   CHOL 282 (H) 11/12/2022   TRIG 331.0 (H) 11/12/2022   HDL 37.90 (L) 11/12/2022   LDLDIRECT 196.0 11/12/2022   ALT 23 12/30/2023   AST 18 12/30/2023   NA 137 12/30/2023   K 4.5 12/30/2023   CL 102 12/30/2023   CREATININE 1.18 12/30/2023   BUN 19 12/30/2023   CO2 27 12/30/2023   TSH 1.20 11/12/2022   PSA 2.78 11/12/2022   HGBA1C 7.2 (H) 12/30/2023    CT CARDIAC SCORING (SELF PAY ONLY) Addendum Date: 11/17/2022 ADDENDUM REPORT: 11/17/2022 10:29 EXAM: OVER-READ INTERPRETATION  CT CHEST The following report is a limited chest CT over-read performed by radiologist Dr. Marcos Sevin [facility] on 11/17/2022. This over-read does not include interpretation of cardiac or coronary anatomy or pathology. The coronary calcium  score interpretation by the cardiologist is attached. COMPARISON:  None. FINDINGS: Mediastinum/Nodes: No enlarged lymph nodes within the visualized mediastinum. Lungs/Pleura: There is no pleural effusion. Small calcified granuloma in the lingula. The visualized lungs are otherwise clear. Upper abdomen: Probable hepatic steatosis. Musculoskeletal/Chest wall: No chest wall mass or suspicious osseous findings within  the visualized chest. Mild thoracic spondylosis. IMPRESSION: 1. No significant extracardiac findings within the visualized lower chest. 2. Probable hepatic steatosis. Electronically Signed   By: Elmon Hagedorn M.D.   On: 11/17/2022 10:29   Result Date: 11/17/2022 CLINICAL DATA:  Cardiovascular Disease Risk stratification EXAM: Coronary Calcium  Score TECHNIQUE: A gated, non-contrast computed tomography scan of the heart was performed using 3mm slice thickness. Axial images were analyzed on a dedicated workstation. Calcium  scoring of the coronary arteries was performed using the Agatston method. FINDINGS: Coronary Calcium  Score: Left main: 0 Left anterior descending  artery: 5.19 Left circumflex artery: 6.71 Right coronary artery: 0 Total: 11.9 Percentile: 46 Pericardium: Normal. Ascending Aorta: Normal caliber.  Mild aortic atherosclerosis. Non-cardiac: See separate report from [facility]. IMPRESSION: Coronary calcium  score of 11.9. This was 25 percentile for age-, race-, and sex-matched controls. Mild aortic atherosclerosis. RECOMMENDATIONS: Coronary artery calcium  (CAC) score is a strong predictor of incident coronary heart disease (CHD) and provides predictive information beyond traditional risk factors. CAC scoring is reasonable to use in the decision to withhold, postpone, or initiate statin therapy in intermediate-risk or selected borderline-risk asymptomatic adults (age 70-75 years and LDL-C >=70 to <190 mg/dL) who do not have diabetes or established atherosclerotic cardiovascular disease (ASCVD).* In intermediate-risk (10-year ASCVD risk >=7.5% to <20%) adults or selected borderline-risk (10-year ASCVD risk >=5% to <7.5%) adults in whom a CAC score is measured for the purpose of making a treatment decision the following recommendations have been made: If CAC=0, it is reasonable to withhold statin therapy and reassess in 5 to 10 years, as long as higher risk conditions are absent (diabetes  mellitus, family history of premature CHD in first degree relatives (males <55 years; females <65 years), cigarette smoking, or LDL >=190 mg/dL). If CAC is 1 to 99, it is reasonable to initiate statin therapy for patients >=6 years of age. If CAC is >=100 or >=75th percentile, it is reasonable to initiate statin therapy at any age. Cardiology referral should be considered for patients with CAC scores >=400 or >=75th percentile. *2018 AHA/ACC/AACVPR/AAPA/ABC/ACPM/ADA/AGS/APhA/ASPC/NLA/PCNA Guideline on the Management of Blood Cholesterol: A Report of the American College of Cardiology/American Heart Association Task Force on Clinical Practice Guidelines. J Am Coll Cardiol. 2019;73(24):3168-3209. Alexandria Angel, MD Electronically Signed: By: Alexandria Angel M.D. On: 11/17/2022 09:28    Assessment & Plan:   Problem List Items Addressed This Visit     Type 2 diabetes mellitus (HCC) - Primary   Ozempic  is causing nausea, diarrhea all the time - off      Relevant Orders   Comprehensive metabolic panel with GFR   Hemoglobin A1c   Coronary atherosclerosis   Coronary calcium  CT score is 11.5.  Fish oil      Trigger finger, acquired   B hands - L hand #3-4, R hand - stiffness #2-3 He is a R handed musician Ortho ref Meloxicam Splint Blue-Emu cream was recommended to use 2-3 times a day       Relevant Orders   Ambulatory referral to Orthopedic Surgery      Meds ordered this encounter  Medications   meloxicam (MOBIC) 15 MG tablet    Sig: Take 1 tablet (15 mg total) by mouth daily.    Dispense:  30 tablet    Refill:  1      Follow-up: No follow-ups on file.  Anitra Barn, MD

## 2024-03-28 NOTE — Assessment & Plan Note (Signed)
 Coronary calcium  CT score is 11.5.  Fish oil

## 2024-03-28 NOTE — Patient Instructions (Signed)
 Blue-Emu cream or Voltaren gel use 2-3 times a day Trigger finger splint

## 2024-03-28 NOTE — Assessment & Plan Note (Signed)
 Ozempic  is causing nausea, diarrhea all the time - off

## 2024-03-28 NOTE — Assessment & Plan Note (Addendum)
 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

## 2024-04-03 ENCOUNTER — Ambulatory Visit: Admitting: Dietician

## 2024-05-10 ENCOUNTER — Ambulatory Visit: Admitting: Internal Medicine

## 2024-05-10 ENCOUNTER — Encounter: Payer: Self-pay | Admitting: Internal Medicine

## 2024-05-10 VITALS — BP 118/78 | HR 54 | Temp 99.1°F | Ht 72.0 in | Wt 238.0 lb

## 2024-05-10 DIAGNOSIS — H9313 Tinnitus, bilateral: Secondary | ICD-10-CM | POA: Diagnosis not present

## 2024-05-10 DIAGNOSIS — H6123 Impacted cerumen, bilateral: Secondary | ICD-10-CM | POA: Diagnosis not present

## 2024-05-10 DIAGNOSIS — Z7984 Long term (current) use of oral hypoglycemic drugs: Secondary | ICD-10-CM | POA: Diagnosis not present

## 2024-05-10 DIAGNOSIS — H9193 Unspecified hearing loss, bilateral: Secondary | ICD-10-CM

## 2024-05-10 DIAGNOSIS — E1165 Type 2 diabetes mellitus with hyperglycemia: Secondary | ICD-10-CM | POA: Diagnosis not present

## 2024-05-10 MED ORDER — REPAGLINIDE 1 MG PO TABS: 1.0000 mg | ORAL_TABLET | Freq: Three times a day (TID) | ORAL | 5 refills | Status: AC

## 2024-05-10 NOTE — Progress Notes (Signed)
 Subjective:  Patient ID: John Kaiser, male    DOB: October 23, 1966  Age: 58 y.o. 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 Accu-Chek Softclix Lancets lancets USE TO CHECK BLOOD GLUCOSE LEVELS TWICE DAILY AS DIRECTED BY MD 100 each 12  . Blood Glucose Monitoring Suppl (ACCU-CHEK AVIVA PLUS) w/Device KIT As directed 1 kit 0  . Cholecalciferol (VITAMIN D3) 50 MCG (2000 UT) capsule Take 2,000 Units by mouth daily.    . ciclopirox  (PENLAC ) 8 % solution Apply topically at bedtime. Apply over nail and surrounding skin daily 6.6 mL 1  . glucose blood (ACCU-CHEK GUIDE TEST) test strip USE TO CHECK BLOOD GLUCOSE TWICE DAILY AS DIRECTED 100 strip 6  . metFORMIN  (GLUCOPHAGE ) 500 MG tablet TAKE 1 TABLET BY MOUTH DAILY WITH BREAKFAST 90 tablet 3  . Multiple Vitamin (MULTI-VITAMIN) tablet Take 1 tablet by mouth daily.    . meloxicam  (MOBIC ) 15 MG tablet Take 1 tablet (15 mg total) by mouth daily. 30 tablet 1  . repaglinide  (PRANDIN ) 1 MG tablet TAKE 1 TABLET BY MOUTH 3 TIMES A DAY BEFORE MEALS 90 tablet 5  . LACTOBACILLUS PO Take by mouth as needed.    . ondansetron  (ZOFRAN ) 4 MG tablet Take 1 tablet (4 mg total) by mouth every 8 (eight) hours as needed. 20 tablet 1  . Semaglutide , 2 MG/DOSE, 8 MG/3ML SOPN Inject 2 mg as directed once a week. 3 mL 6   No facility-administered medications prior to visit.    ROS: Review of Systems  Constitutional:  Negative for appetite change, fatigue and unexpected weight change.  HENT:  Positive for hearing loss and tinnitus. Negative for congestion, nosebleeds, sneezing, sore throat, trouble swallowing and voice change.   Eyes:  Negative for  itching and visual disturbance.  Respiratory:  Negative for cough.   Cardiovascular:  Negative for chest pain, palpitations and leg swelling.  Gastrointestinal:  Negative for abdominal distention, blood in stool, diarrhea and nausea.  Genitourinary:  Negative for frequency and hematuria.  Musculoskeletal:  Negative for back pain, gait problem, joint swelling and neck pain.  Skin:  Negative for rash.  Neurological:  Negative for dizziness, tremors, speech difficulty and weakness.  Psychiatric/Behavioral:  Negative for agitation, dysphoric mood and sleep disturbance. The patient is not nervous/anxious.     Objective:  BP 118/78   Pulse (!) 54   Temp 99.1 F (37.3 C) (Oral)   Ht 6' (1.829 m)   Wt 238 lb (108 kg)   SpO2 97%   BMI 32.28 kg/m   BP Readings from Last 3 Encounters:  05/10/24 118/78  03/28/24 138/78  12/30/23 (!) 140/90    Wt Readings from Last 3 Encounters:  05/10/24 238 lb (108 kg)  03/28/24 238 lb 9.6 oz (108.2 kg)  12/30/23 240 lb (108.9 kg)    Physical Exam Constitutional:      General: He is not in acute distress.    Appearance: He is well-developed. He is obese.     Comments: NAD  HENT:     Right Ear: There is impacted cerumen.     Left Ear: There  is impacted cerumen.  Eyes:     Conjunctiva/sclera: Conjunctivae normal.     Pupils: Pupils are equal, round, and reactive to light.  Neck:     Thyroid : No thyromegaly.     Vascular: No JVD.  Cardiovascular:     Rate and Rhythm: Normal rate and regular rhythm.     Heart sounds: Normal heart sounds. No murmur heard.    No friction rub. No gallop.  Pulmonary:     Effort: Pulmonary effort is normal. No respiratory distress.     Breath sounds: Normal breath sounds. No wheezing or rales.  Chest:     Chest wall: No tenderness.  Abdominal:     General: Bowel sounds are normal. There is no distension.     Palpations: Abdomen is soft. There is no mass.     Tenderness: There is no abdominal tenderness. There  is no guarding or rebound.  Musculoskeletal:        General: No tenderness. Normal range of motion.     Cervical back: Normal range of motion.  Lymphadenopathy:     Cervical: No cervical adenopathy.  Skin:    General: Skin is warm and dry.     Findings: No rash.  Neurological:     Mental Status: He is alert and oriented to person, place, and time.     Cranial Nerves: No cranial nerve deficit.     Motor: No abnormal muscle tone.     Coordination: Coordination normal.     Gait: Gait normal.     Deep Tendon Reflexes: Reflexes are normal and symmetric.  Psychiatric:        Behavior: Behavior normal.        Thought Content: Thought content normal.        Judgment: Judgment normal.   Partial wax impaction Wax was removed R>L  Lab Results  Component Value Date   WBC 8.1 11/12/2022   HGB 16.5 11/12/2022   HCT 47.6 11/12/2022   PLT 258.0 11/12/2022   GLUCOSE 165 (H) 12/30/2023   CHOL 282 (H) 11/12/2022   TRIG 331.0 (H) 11/12/2022   HDL 37.90 (L) 11/12/2022   LDLDIRECT 196.0 11/12/2022   ALT 23 12/30/2023   AST 18 12/30/2023   NA 137 12/30/2023   K 4.5 12/30/2023   CL 102 12/30/2023   CREATININE 1.18 12/30/2023   BUN 19 12/30/2023   CO2 27 12/30/2023   TSH 1.20 11/12/2022   PSA 2.78 11/12/2022   HGBA1C 7.2 (H) 12/30/2023    CT CARDIAC SCORING (SELF PAY ONLY) Addendum Date: 11/17/2022 ADDENDUM REPORT: 11/17/2022 10:29 EXAM: OVER-READ INTERPRETATION  CT CHEST The following report is a limited chest CT over-read performed by radiologist Dr. Elsie Ko [facility] on 11/17/2022. This over-read does not include interpretation of cardiac or coronary anatomy or pathology. The coronary calcium  score interpretation by the cardiologist is attached. COMPARISON:  None. FINDINGS: Mediastinum/Nodes: No enlarged lymph nodes within the visualized mediastinum. Lungs/Pleura: There is no pleural effusion. Small calcified granuloma in the lingula. The visualized lungs are  otherwise clear. Upper abdomen: Probable hepatic steatosis. Musculoskeletal/Chest wall: No chest wall mass or suspicious osseous findings within the visualized chest. Mild thoracic spondylosis. IMPRESSION: 1. No significant extracardiac findings within the visualized lower chest. 2. Probable hepatic steatosis. Electronically Signed   By: Elsie Perone M.D.   On: 11/17/2022 10:29   Result Date: 11/17/2022 CLINICAL DATA:  Cardiovascular Disease Risk stratification EXAM: Coronary Calcium  Score TECHNIQUE: A gated, non-contrast computed tomography scan of  the heart was performed using 3mm slice thickness. Axial images were analyzed on a dedicated workstation. Calcium  scoring of the coronary arteries was performed using the Agatston method. FINDINGS: Coronary Calcium  Score: Left main: 0 Left anterior descending artery: 5.19 Left circumflex artery: 6.71 Right coronary artery: 0 Total: 11.9 Percentile: 46 Pericardium: Normal. Ascending Aorta: Normal caliber.  Mild aortic atherosclerosis. Non-cardiac: See separate report from [facility]. IMPRESSION: Coronary calcium  score of 11.9. This was 53 percentile for age-, race-, and sex-matched controls. Mild aortic atherosclerosis. RECOMMENDATIONS: Coronary artery calcium  (CAC) score is a strong predictor of incident coronary heart disease (CHD) and provides predictive information beyond traditional risk factors. CAC scoring is reasonable to use in the decision to withhold, postpone, or initiate statin therapy in intermediate-risk or selected borderline-risk asymptomatic adults (age 27-75 years and LDL-C >=70 to <190 mg/dL) who do not have diabetes or established atherosclerotic cardiovascular disease (ASCVD).* In intermediate-risk (10-year ASCVD risk >=7.5% to <20%) adults or selected borderline-risk (10-year ASCVD risk >=5% to <7.5%) adults in whom a CAC score is measured for the purpose of making a treatment decision the following recommendations have been made: If  CAC=0, it is reasonable to withhold statin therapy and reassess in 5 to 10 years, as long as higher risk conditions are absent (diabetes mellitus, family history of premature CHD in first degree relatives (males <55 years; females <65 years), cigarette smoking, or LDL >=190 mg/dL). If CAC is 1 to 99, it is reasonable to initiate statin therapy for patients >=4 years of age. If CAC is >=100 or >=75th percentile, it is reasonable to initiate statin therapy at any age. Cardiology referral should be considered for patients with CAC scores >=400 or >=75th percentile. *2018 AHA/ACC/AACVPR/AAPA/ABC/ACPM/ADA/AGS/APhA/ASPC/NLA/PCNA Guideline on the Management of Blood Cholesterol: A Report of the American College of Cardiology/American Heart Association Task Force on Clinical Practice Guidelines. J Am Coll Cardiol. 2019;73(24):3168-3209. Redell Shallow, MD Electronically Signed: By: Redell Shallow M.D. On: 11/17/2022 09:28    Assessment & Plan:   Problem List Items Addressed This Visit     Cerumen impaction   Cleaned out w/a loop -  B       Type 2 diabetes mellitus (HCC)   Ozempic  is causing nausea, diarrhea all the time - off      Relevant Medications   repaglinide  (PRANDIN ) 1 MG tablet   Other Visit Diagnoses       Tinnitus of both ears    -  Primary   Relevant Orders   Ambulatory referral to Audiology     Decreased hearing of both ears       Relevant Orders   Ambulatory referral to Audiology         Meds ordered this encounter  Medications  . repaglinide  (PRANDIN ) 1 MG tablet    Sig: Take 1 tablet (1 mg total) by mouth 3 (three) times daily before meals.    Dispense:  90 tablet    Refill:  5      Follow-up: Return in about 3 months (around 08/10/2024) for a follow-up visit.  Marolyn Noel, MD

## 2024-05-10 NOTE — Assessment & Plan Note (Signed)
 Ozempic  is causing nausea, diarrhea all the time - off

## 2024-05-10 NOTE — Assessment & Plan Note (Addendum)
 Cleaned out w/a loop -  B

## 2024-05-15 ENCOUNTER — Other Ambulatory Visit (INDEPENDENT_AMBULATORY_CARE_PROVIDER_SITE_OTHER)

## 2024-05-15 ENCOUNTER — Other Ambulatory Visit: Payer: Self-pay | Admitting: Internal Medicine

## 2024-05-15 DIAGNOSIS — E1165 Type 2 diabetes mellitus with hyperglycemia: Secondary | ICD-10-CM | POA: Diagnosis not present

## 2024-05-15 LAB — COMPREHENSIVE METABOLIC PANEL WITH GFR
ALT: 22 U/L (ref 0–53)
AST: 17 U/L (ref 0–37)
Albumin: 4.4 g/dL (ref 3.5–5.2)
Alkaline Phosphatase: 36 U/L — ABNORMAL LOW (ref 39–117)
BUN: 14 mg/dL (ref 6–23)
CO2: 26 meq/L (ref 19–32)
Calcium: 9.4 mg/dL (ref 8.4–10.5)
Chloride: 102 meq/L (ref 96–112)
Creatinine, Ser: 1.07 mg/dL (ref 0.40–1.50)
GFR: 75.86 mL/min (ref >60.00)
Glucose, Bld: 155 mg/dL — ABNORMAL HIGH (ref 70–99)
Potassium: 4.4 meq/L (ref 3.5–5.1)
Sodium: 136 meq/L (ref 135–145)
Total Bilirubin: 0.5 mg/dL (ref 0.2–1.2)
Total Protein: 7.4 g/dL (ref 6.0–8.3)

## 2024-05-15 LAB — HEMOGLOBIN A1C: Hgb A1c MFr Bld: 7 % — ABNORMAL HIGH (ref 4.6–6.5)

## 2024-05-16 ENCOUNTER — Ambulatory Visit: Payer: Self-pay | Admitting: Internal Medicine

## 2024-05-16 LAB — HM DIABETES EYE EXAM

## 2024-05-18 ENCOUNTER — Encounter: Payer: Self-pay | Admitting: Internal Medicine

## 2024-08-10 ENCOUNTER — Ambulatory Visit: Admitting: Internal Medicine

## 2024-08-22 ENCOUNTER — Encounter: Payer: Self-pay | Admitting: Internal Medicine

## 2024-08-22 ENCOUNTER — Ambulatory Visit (INDEPENDENT_AMBULATORY_CARE_PROVIDER_SITE_OTHER): Admitting: Internal Medicine

## 2024-08-22 ENCOUNTER — Ambulatory Visit

## 2024-08-22 VITALS — BP 142/82 | HR 81 | Temp 98.1°F | Ht 72.0 in | Wt 242.8 lb

## 2024-08-22 DIAGNOSIS — M79641 Pain in right hand: Secondary | ICD-10-CM

## 2024-08-22 DIAGNOSIS — M79642 Pain in left hand: Secondary | ICD-10-CM

## 2024-08-22 DIAGNOSIS — K760 Fatty (change of) liver, not elsewhere classified: Secondary | ICD-10-CM | POA: Diagnosis not present

## 2024-08-22 DIAGNOSIS — E1165 Type 2 diabetes mellitus with hyperglycemia: Secondary | ICD-10-CM

## 2024-08-22 DIAGNOSIS — E669 Obesity, unspecified: Secondary | ICD-10-CM | POA: Diagnosis not present

## 2024-08-22 DIAGNOSIS — Z7985 Long-term (current) use of injectable non-insulin antidiabetic drugs: Secondary | ICD-10-CM

## 2024-08-22 LAB — TSH: TSH: 1.77 u[IU]/mL (ref 0.35–5.50)

## 2024-08-22 LAB — HEMOGLOBIN A1C: Hgb A1c MFr Bld: 7 % — ABNORMAL HIGH (ref 4.6–6.5)

## 2024-08-22 LAB — SEDIMENTATION RATE: Sed Rate: 13 mm/h (ref 0–20)

## 2024-08-22 MED ORDER — CELECOXIB 200 MG PO CAPS: 200.0000 mg | ORAL_CAPSULE | Freq: Every day | ORAL | 3 refills | Status: DC

## 2024-08-22 NOTE — Assessment & Plan Note (Signed)
 Wt Readings from Last 3 Encounters:  08/22/24 242 lb 12.8 oz (110.1 kg)  05/10/24 238 lb (108 kg)  03/28/24 238 lb 9.6 oz (108.2 kg)  Try to loose wt

## 2024-08-22 NOTE — Assessment & Plan Note (Addendum)
 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  I doubt inflammatory joint disease Try Celebrex at hs 200 mg Splints advised to wear at night R, L hand X ray Labs w/RF etc He did see a hand surgeon

## 2024-08-22 NOTE — Progress Notes (Signed)
 Subjective:  Patient ID: John Kaiser, male    DOB: 28-Mar-1966  Age: 58 y.o. MRN: [id number]  CC: Medical Management of Chronic Issues (Follow up for diabetes management)   HPI EDYN QAZI presents for hand stiffness, pain, trigger finger, DM  Outpatient Medications Prior to Visit  Medication Sig Dispense Refill   Accu-Chek Softclix Lancets lancets USE TO CHECK BLOOD GLUCOSE LEVELS TWICE DAILY AS DIRECTED BY MD 100 each 12   Blood Glucose Monitoring Suppl (ACCU-CHEK AVIVA PLUS) w/Device KIT As directed 1 kit 0   Cholecalciferol (VITAMIN D3) 50 MCG (2000 UT) capsule Take 2,000 Units by mouth daily.     ciclopirox  (PENLAC ) 8 % solution Apply topically at bedtime. Apply over nail and surrounding skin daily 6.6 mL 1   glucose blood (ACCU-CHEK GUIDE TEST) test strip USE TO CHECK BLOOD GLUCOSE TWICE DAILY AS DIRECTED 100 strip 6   metFORMIN  (GLUCOPHAGE ) 500 MG tablet TAKE 1 TABLET BY MOUTH DAILY WITH BREAKFAST 90 tablet 3   Multiple Vitamin (MULTI-VITAMIN) tablet Take 1 tablet by mouth daily.     repaglinide  (PRANDIN ) 1 MG tablet Take 1 tablet (1 mg total) by mouth 3 (three) times daily before meals. 90 tablet 5   No facility-administered medications prior to visit.    ROS: Review of Systems  Constitutional:  Negative for appetite change, fatigue and unexpected weight change.  HENT:  Negative for congestion, nosebleeds, sneezing, sore throat and trouble swallowing.   Eyes:  Negative for itching and visual disturbance.  Respiratory:  Negative for cough.   Cardiovascular:  Negative for chest pain, palpitations and leg swelling.  Gastrointestinal:  Negative for abdominal distention, blood in stool, diarrhea and nausea.  Genitourinary:  Negative for frequency and hematuria.  Musculoskeletal:  Positive for arthralgias. Negative for back pain, gait problem, joint swelling and neck pain.  Skin:  Negative for rash.  Neurological:  Negative for dizziness, tremors, speech difficulty and  weakness.  Psychiatric/Behavioral:  Negative for agitation, dysphoric mood and sleep disturbance. The patient is not nervous/anxious.     Objective:  BP (!) 142/82   Pulse 81   Temp 98.1 F (36.7 C)   Ht 6' (1.829 m)   Wt 242 lb 12.8 oz (110.1 kg)   SpO2 97%   BMI 32.93 kg/m   BP Readings from Last 3 Encounters:  08/22/24 (!) 142/82  05/10/24 118/78  03/28/24 138/78    Wt Readings from Last 3 Encounters:  08/22/24 242 lb 12.8 oz (110.1 kg)  05/10/24 238 lb (108 kg)  03/28/24 238 lb 9.6 oz (108.2 kg)    Physical Exam Constitutional:      General: He is not in acute distress.    Appearance: He is well-developed.     Comments: NAD  Eyes:     Conjunctiva/sclera: Conjunctivae normal.     Pupils: Pupils are equal, round, and reactive to light.  Neck:     Thyroid : No thyromegaly.     Vascular: No JVD.  Cardiovascular:     Rate and Rhythm: Normal rate and regular rhythm.     Heart sounds: Normal heart sounds. No murmur heard.    No friction rub. No gallop.  Pulmonary:     Effort: Pulmonary effort is normal. No respiratory distress.     Breath sounds: Normal breath sounds. No wheezing or rales.  Chest:     Chest wall: No tenderness.  Abdominal:     General: Bowel sounds are normal. There is no distension.  Palpations: Abdomen is soft. There is no mass.     Tenderness: There is no abdominal tenderness. There is no guarding or rebound.  Musculoskeletal:        General: No tenderness. Normal range of motion.     Cervical back: Normal range of motion.  Lymphadenopathy:     Cervical: No cervical adenopathy.  Skin:    General: Skin is warm and dry.     Findings: No rash.  Neurological:     Mental Status: He is alert and oriented to person, place, and time.     Cranial Nerves: No cranial nerve deficit.     Motor: No abnormal muscle tone.     Coordination: Coordination normal.     Gait: Gait normal.     Deep Tendon Reflexes: Reflexes are normal and symmetric.   Psychiatric:        Behavior: Behavior normal.        Thought Content: Thought content normal.        Judgment: Judgment normal.   Hand joints without deformities, swelling, erythema Good range of motion at present  Lab Results  Component Value Date   WBC 8.1 11/12/2022   HGB 16.5 11/12/2022   HCT 47.6 11/12/2022   PLT 258.0 11/12/2022   GLUCOSE 155 (H) 05/15/2024   CHOL 282 (H) 11/12/2022   TRIG 331.0 (H) 11/12/2022   HDL 37.90 (L) 11/12/2022   LDLDIRECT 196.0 11/12/2022   ALT 22 05/15/2024   AST 17 05/15/2024   NA 136 05/15/2024   K 4.4 05/15/2024   CL 102 05/15/2024   CREATININE 1.07 05/15/2024   BUN 14 05/15/2024   CO2 26 05/15/2024   TSH 1.77 08/22/2024   PSA 2.78 11/12/2022   HGBA1C 7.0 (H) 08/22/2024    CT CARDIAC SCORING (SELF PAY ONLY) Addendum Date: 11/17/2022 ADDENDUM REPORT: 11/17/2022 10:29 EXAM: OVER-READ INTERPRETATION  CT CHEST The following report is a limited chest CT over-read performed by radiologist Dr. Elsie Ko [facility] on 11/17/2022. This over-read does not include interpretation of cardiac or coronary anatomy or pathology. The coronary calcium  score interpretation by the cardiologist is attached. COMPARISON:  None. FINDINGS: Mediastinum/Nodes: No enlarged lymph nodes within the visualized mediastinum. Lungs/Pleura: There is no pleural effusion. Small calcified granuloma in the lingula. The visualized lungs are otherwise clear. Upper abdomen: Probable hepatic steatosis. Musculoskeletal/Chest wall: No chest wall mass or suspicious osseous findings within the visualized chest. Mild thoracic spondylosis. IMPRESSION: 1. No significant extracardiac findings within the visualized lower chest. 2. Probable hepatic steatosis. Electronically Signed   By: Elsie Perone M.D.   On: 11/17/2022 10:29   Result Date: 11/17/2022 CLINICAL DATA:  Cardiovascular Disease Risk stratification EXAM: Coronary Calcium  Score TECHNIQUE: A gated, non-contrast  computed tomography scan of the heart was performed using 3mm slice thickness. Axial images were analyzed on a dedicated workstation. Calcium  scoring of the coronary arteries was performed using the Agatston method. FINDINGS: Coronary Calcium  Score: Left main: 0 Left anterior descending artery: 5.19 Left circumflex artery: 6.71 Right coronary artery: 0 Total: 11.9 Percentile: 46 Pericardium: Normal. Ascending Aorta: Normal caliber.  Mild aortic atherosclerosis. Non-cardiac: See separate report from [facility]. IMPRESSION: Coronary calcium  score of 11.9. This was 51 percentile for age-, race-, and sex-matched controls. Mild aortic atherosclerosis. RECOMMENDATIONS: Coronary artery calcium  (CAC) score is a strong predictor of incident coronary heart disease (CHD) and provides predictive information beyond traditional risk factors. CAC scoring is reasonable to use in the decision to withhold, postpone, or initiate  statin therapy in intermediate-risk or selected borderline-risk asymptomatic adults (age 15-75 years and LDL-C >=70 to <190 mg/dL) who do not have diabetes or established atherosclerotic cardiovascular disease (ASCVD).* In intermediate-risk (10-year ASCVD risk >=7.5% to <20%) adults or selected borderline-risk (10-year ASCVD risk >=5% to <7.5%) adults in whom a CAC score is measured for the purpose of making a treatment decision the following recommendations have been made: If CAC=0, it is reasonable to withhold statin therapy and reassess in 5 to 10 years, as long as higher risk conditions are absent (diabetes mellitus, family history of premature CHD in first degree relatives (males <55 years; females <65 years), cigarette smoking, or LDL >=190 mg/dL). If CAC is 1 to 99, it is reasonable to initiate statin therapy for patients >=98 years of age. If CAC is >=100 or >=75th percentile, it is reasonable to initiate statin therapy at any age. Cardiology referral should be considered for patients with  CAC scores >=400 or >=75th percentile. *2018 AHA/ACC/AACVPR/AAPA/ABC/ACPM/ADA/AGS/APhA/ASPC/NLA/PCNA Guideline on the Management of Blood Cholesterol: A Report of the American College of Cardiology/American Heart Association Task Force on Clinical Practice Guidelines. J Am Coll Cardiol. 2019;73(24):3168-3209. Redell Shallow, MD Electronically Signed: By: Redell Shallow M.D. 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  I doubt inflammatory joint disease Try Celebrex at hs 200 mg Splints advised to wear at night R, L hand X ray Labs w/RF etc He did see a hand surgeon      Relevant Orders   TSH (Completed)   Hemoglobin A1c (Completed)   Rheumatoid factor (Completed)   Sedimentation rate (Completed)   DG Hand Complete Right (Completed)   DG Hand Complete Left (Completed)   Type 2 diabetes mellitus (HCC) - Primary   Doing well on meds Check A1 Will try Mounjaro  if covered      Relevant Orders   TSH (Completed)   Hemoglobin A1c (Completed)   Rheumatoid factor (Completed)   Sedimentation rate (Completed)   DG Hand Complete Right (Completed)      Meds ordered this encounter  Medications   celecoxib (CELEBREX) 200 MG capsule    Sig: Take 1 capsule (200 mg total) by mouth daily. With dinner    Dispense:  30 capsule    Refill:  3      Follow-up: Return in about 3 months (around 11/22/2024) for a follow-up visit.  Marolyn Noel, MD

## 2024-08-22 NOTE — Assessment & Plan Note (Addendum)
 Doing well on meds Check A1 Will try Mounjaro  if covered

## 2024-08-23 ENCOUNTER — Ambulatory Visit: Payer: Self-pay | Admitting: Internal Medicine

## 2024-08-23 LAB — RHEUMATOID FACTOR: Rheumatoid fact SerPl-aCnc: <10 [IU]/mL (ref <14)

## 2024-08-28 NOTE — Assessment & Plan Note (Signed)
 Will try Mounjaro  if covered Cut back on carbs

## 2024-09-11 ENCOUNTER — Ambulatory Visit: Payer: Self-pay

## 2024-09-11 NOTE — Telephone Encounter (Signed)
 FYI Only or Action Required?: FYI only for provider: appointment scheduled on 11/11.  Patient was last seen in primary care on 08/22/2024 by Plotnikov, Karlynn GAILS, MD.  Called Nurse Triage reporting Cough.  Symptoms began a week ago.  Interventions attempted: OTC medications: Mucinex, nasal flush.  Symptoms are: gradually worsening.  Triage Disposition: See PCP When Office is Open (Within 3 Days)  Patient/caregiver understands and will follow disposition?: Yes  Copied from CRM #[id number]. Topic: Clinical - Red Word Triage >> Sep 11, 2024  9:23 AM Vena HERO wrote: Red Word that prompted transfer to Nurse Triage: cough, phlegm, head pressure and congestion Reason for Disposition  [1] Nasal discharge AND [2] present > 10 days  Answer Assessment - Initial Assessment Questions Symptoms for about a week- mucinex helping some but more masking it. Hydrated, nasal saline washes. All help temporarily but then comes back full force.  ED/UC precautions given and understood. Apt with PCP 11/11 to discuss  1. ONSET: When did the cough begin?      About a week ago  2. SEVERITY: How bad is the cough today?      Coughing throughout conversations  3. SPUTUM: Describe the color of your sputum (e.g., none, dry cough; clear, white, yellow, green)     Yellow, green thick  4. HEMOPTYSIS: Are you coughing up any blood? If Yes, ask: How much? (e.g., flecks, streaks, tablespoons, etc.)     denies 5. DIFFICULTY BREATHING: Are you having difficulty breathing? If Yes, ask: How bad is it? (e.g., mild, moderate, severe)      denies 6. FEVER: Do you have a fever? If Yes, ask: What is your temperature, how was it measured, and when did it start?     Maybe one day last week, chills but didn't check temp  7. CARDIAC HISTORY: Do you have any history of heart disease? (e.g., heart attack, congestive heart failure)      Coronary athero 8. LUNG HISTORY: Do you have any history of lung disease?   (e.g., pulmonary embolus, asthma, emphysema)     OSA 9. PE RISK FACTORS: Do you have a history of blood clots? (or: recent major surgery, recent prolonged travel, bedridden)     denies 10. OTHER SYMPTOMS: Do you have any other symptoms? (e.g., runny nose, wheezing, chest pain)       Head pressure, chest congestion 12. TRAVEL: Have you traveled out of the country in the last month? (e.g., travel history, exposures)       denies  Protocols used: Cough - Acute Productive-A-AH

## 2024-09-12 ENCOUNTER — Ambulatory Visit: Admitting: Internal Medicine

## 2024-09-12 ENCOUNTER — Encounter: Payer: Self-pay | Admitting: Internal Medicine

## 2024-09-12 VITALS — BP 122/86 | HR 86 | Temp 98.5°F | Ht 72.0 in | Wt 241.0 lb

## 2024-09-12 DIAGNOSIS — K219 Gastro-esophageal reflux disease without esophagitis: Secondary | ICD-10-CM

## 2024-09-12 DIAGNOSIS — J069 Acute upper respiratory infection, unspecified: Secondary | ICD-10-CM | POA: Diagnosis not present

## 2024-09-12 DIAGNOSIS — E1165 Type 2 diabetes mellitus with hyperglycemia: Secondary | ICD-10-CM

## 2024-09-12 MED ORDER — HYDROCODONE BIT-HOMATROP MBR 5-1.5 MG/5ML PO SOLN: 5.0000 mL | Freq: Four times a day (QID) | ORAL | 0 refills | Status: DC | PRN

## 2024-09-12 MED ORDER — PANTOPRAZOLE SODIUM 40 MG PO TBEC: 40.0000 mg | DELAYED_RELEASE_TABLET | Freq: Every day | ORAL | 5 refills | Status: AC

## 2024-09-12 MED ORDER — AZITHROMYCIN 250 MG PO TABS: ORAL_TABLET | ORAL | 0 refills | Status: AC

## 2024-09-12 NOTE — Progress Notes (Signed)
 Subjective:  Patient ID: John Kaiser, male    DOB: [birth year]  Age: 58 y.o. MRN: [id number]  CC: Cough (cough, phlegm, head pressure and congestion, symptoms for one week. Treating with mucinex and saline rinses)   HPI John Kaiser presents for URI - cold symptoms for several days, getting worse C/o occ epigastric pain and heartburn after dinner  Outpatient Medications Prior to Visit  Medication Sig Dispense Refill   Accu-Chek Softclix Lancets lancets USE TO CHECK BLOOD GLUCOSE LEVELS TWICE DAILY AS DIRECTED BY MD 100 each 12   Blood Glucose Monitoring Suppl (ACCU-CHEK AVIVA PLUS) w/Device KIT As directed 1 kit 0   celecoxib  (CELEBREX ) 200 MG capsule Take 1 capsule (200 mg total) by mouth daily. With dinner 30 capsule 3   Cholecalciferol (VITAMIN D3) 50 MCG (2000 UT) capsule Take 2,000 Units by mouth daily.     ciclopirox  (PENLAC ) 8 % solution Apply topically at bedtime. Apply over nail and surrounding skin daily 6.6 mL 1   glucose blood (ACCU-CHEK GUIDE TEST) test strip USE TO CHECK BLOOD GLUCOSE TWICE DAILY AS DIRECTED 100 strip 6   metFORMIN  (GLUCOPHAGE ) 500 MG tablet TAKE 1 TABLET BY MOUTH DAILY WITH BREAKFAST 90 tablet 3   Multiple Vitamin (MULTI-VITAMIN) tablet Take 1 tablet by mouth daily.     repaglinide  (PRANDIN ) 1 MG tablet Take 1 tablet (1 mg total) by mouth 3 (three) times daily before meals. 90 tablet 5   No facility-administered medications prior to visit.    ROS: Review of Systems  Constitutional:  Positive for fatigue. Negative for appetite change and unexpected weight change.  HENT:  Positive for congestion and sore throat. Negative for nosebleeds, sneezing and trouble swallowing.   Eyes:  Negative for itching and visual disturbance.  Respiratory:  Positive for cough. Negative for wheezing.   Cardiovascular:  Negative for chest pain, palpitations and leg swelling.  Gastrointestinal:  Negative for abdominal distention, blood in stool, diarrhea and nausea.   Genitourinary:  Negative for frequency and hematuria.  Musculoskeletal:  Negative for back pain, gait problem, joint swelling and neck pain.  Skin:  Negative for rash.  Neurological:  Negative for dizziness, tremors, speech difficulty and weakness.  Psychiatric/Behavioral:  Negative for agitation, dysphoric mood and sleep disturbance. The patient is not nervous/anxious.     Objective:  BP 122/86   Pulse 86   Temp 98.5 F (36.9 C)   Ht 6' (1.829 m)   Wt 241 lb (109.3 kg)   SpO2 96%   BMI 32.69 kg/m   BP Readings from Last 3 Encounters:  09/12/24 122/86  08/22/24 (!) 142/82  05/10/24 118/78    Wt Readings from Last 3 Encounters:  09/12/24 241 lb (109.3 kg)  08/22/24 242 lb 12.8 oz (110.1 kg)  05/10/24 238 lb (108 kg)    Physical Exam Constitutional:      General: He is not in acute distress.    Appearance: He is well-developed.     Comments: NAD  HENT:     Nose: Congestion and rhinorrhea present.     Mouth/Throat:     Pharynx: Posterior oropharyngeal erythema present. No oropharyngeal exudate.  Eyes:     Conjunctiva/sclera: Conjunctivae normal.     Pupils: Pupils are equal, round, and reactive to light.  Neck:     Thyroid : No thyromegaly.     Vascular: No JVD.  Cardiovascular:     Rate and Rhythm: Normal rate and regular rhythm.     Heart sounds: Normal  heart sounds. No murmur heard.    No friction rub. No gallop.  Pulmonary:     Effort: Pulmonary effort is normal. No respiratory distress.     Breath sounds: Normal breath sounds. No wheezing or rales.  Chest:     Chest wall: No tenderness.  Abdominal:     General: Bowel sounds are normal. There is no distension.     Palpations: Abdomen is soft. There is no mass.     Tenderness: There is no abdominal tenderness. There is no guarding or rebound.  Musculoskeletal:        General: No tenderness. Normal range of motion.     Cervical back: Normal range of motion.  Lymphadenopathy:     Cervical: No cervical  adenopathy.  Skin:    General: Skin is warm and dry.     Findings: No rash.  Neurological:     Mental Status: He is alert and oriented to person, place, and time.     Cranial Nerves: No cranial nerve deficit.     Motor: No abnormal muscle tone.     Coordination: Coordination normal.     Gait: Gait normal.     Deep Tendon Reflexes: Reflexes are normal and symmetric.  Psychiatric:        Behavior: Behavior normal.        Thought Content: Thought content normal.        Judgment: Judgment normal.     Lab Results  Component Value Date   WBC 8.1 11/12/2022   HGB 16.5 11/12/2022   HCT 47.6 11/12/2022   PLT 258.0 11/12/2022   GLUCOSE 155 (H) 05/15/2024   CHOL 282 (H) 11/12/2022   TRIG 331.0 (H) 11/12/2022   HDL 37.90 (L) 11/12/2022   LDLDIRECT 196.0 11/12/2022   ALT 22 05/15/2024   AST 17 05/15/2024   NA 136 05/15/2024   K 4.4 05/15/2024   CL 102 05/15/2024   CREATININE 1.07 05/15/2024   BUN 14 05/15/2024   CO2 26 05/15/2024   TSH 1.77 08/22/2024   PSA 2.78 11/12/2022   HGBA1C 7.0 (H) 08/22/2024    CT CARDIAC SCORING (SELF PAY ONLY) Addendum Date: 11/17/2022 ADDENDUM REPORT: 11/17/2022 10:29 EXAM: OVER-READ INTERPRETATION  CT CHEST The following report is a limited chest CT over-read performed by radiologist Dr. Elsie Ko [facility] on 11/17/2022. This over-read does not include interpretation of cardiac or coronary anatomy or pathology. The coronary calcium  score interpretation by the cardiologist is attached. COMPARISON:  None. FINDINGS: Mediastinum/Nodes: No enlarged lymph nodes within the visualized mediastinum. Lungs/Pleura: There is no pleural effusion. Small calcified granuloma in the lingula. The visualized lungs are otherwise clear. Upper abdomen: Probable hepatic steatosis. Musculoskeletal/Chest wall: No chest wall mass or suspicious osseous findings within the visualized chest. Mild thoracic spondylosis. IMPRESSION: 1. No significant extracardiac  findings within the visualized lower chest. 2. Probable hepatic steatosis. Electronically Signed   By: Elsie Perone M.D.   On: 11/17/2022 10:29   Result Date: 11/17/2022 CLINICAL DATA:  Cardiovascular Disease Risk stratification EXAM: Coronary Calcium  Score TECHNIQUE: A gated, non-contrast computed tomography scan of the heart was performed using 3mm slice thickness. Axial images were analyzed on a dedicated workstation. Calcium  scoring of the coronary arteries was performed using the Agatston method. FINDINGS: Coronary Calcium  Score: Left main: 0 Left anterior descending artery: 5.19 Left circumflex artery: 6.71 Right coronary artery: 0 Total: 11.9 Percentile: 46 Pericardium: Normal. Ascending Aorta: Normal caliber.  Mild aortic atherosclerosis. Non-cardiac: See separate report from [facility]. IMPRESSION: Coronary calcium  score of 11.9. This was 44 percentile for age-, race-, and sex-matched controls. Mild aortic atherosclerosis. RECOMMENDATIONS: Coronary artery calcium  (CAC) score is a strong predictor of incident coronary heart disease (CHD) and provides predictive information beyond traditional risk factors. CAC scoring is reasonable to use in the decision to withhold, postpone, or initiate statin therapy in intermediate-risk or selected borderline-risk asymptomatic adults (age 62-75 years and LDL-C >=70 to <190 mg/dL) who do not have diabetes or established atherosclerotic cardiovascular disease (ASCVD).* In intermediate-risk (10-year ASCVD risk >=7.5% to <20%) adults or selected borderline-risk (10-year ASCVD risk >=5% to <7.5%) adults in whom a CAC score is measured for the purpose of making a treatment decision the following recommendations have been made: If CAC=0, it is reasonable to withhold statin therapy and reassess in 5 to 10 years, as long as higher risk conditions are absent (diabetes mellitus, family history of premature CHD in first degree relatives (males <55 years; females <65  years), cigarette smoking, or LDL >=190 mg/dL). If CAC is 1 to 99, it is reasonable to initiate statin therapy for patients >=41 years of age. If CAC is >=100 or >=75th percentile, it is reasonable to initiate statin therapy at any age. Cardiology referral should be considered for patients with CAC scores >=400 or >=75th percentile. *2018 AHA/ACC/AACVPR/AAPA/ABC/ACPM/ADA/AGS/APhA/ASPC/NLA/PCNA Guideline on the Management of Blood Cholesterol: A Report of the American College of Cardiology/American Heart Association Task Force on Clinical Practice Guidelines. J Am Coll Cardiol. 2019;73(24):3168-3209. Redell Shallow, MD Electronically Signed: By: Redell Shallow M.D. On: 11/17/2022 09:28    Assessment & Plan:   Problem List Items Addressed This Visit     GERD (gastroesophageal reflux disease)   Worsening symptoms.  Prescribed pantoprazole  40 mg daily      Relevant Medications   pantoprazole  (PROTONIX ) 40 MG tablet   Type 2 diabetes mellitus (HCC)   Doing well on meds Monitor A1       Upper respiratory infection - Primary   New.  Prescribed Z-Pak.  Hycodan cough syrup as needed      Relevant Medications   azithromycin  (ZITHROMAX  Z-PAK) 250 MG tablet      Meds ordered this encounter  Medications   pantoprazole  (PROTONIX ) 40 MG tablet    Sig: Take 1 tablet (40 mg total) by mouth daily.    Dispense:  30 tablet    Refill:  5   azithromycin  (ZITHROMAX  Z-PAK) 250 MG tablet    Sig: As directed    Dispense:  6 tablet    Refill:  0   HYDROcodone  bit-homatropine (HYCODAN) 5-1.5 MG/5ML syrup    Sig: Take 5 mLs by mouth every 6 (six) hours as needed for cough.    Dispense:  240 mL    Refill:  0      Follow-up: Return in about 4 months (around 01/10/2025).  Marolyn Noel, MD

## 2024-10-01 ENCOUNTER — Encounter: Payer: Self-pay | Admitting: Internal Medicine

## 2024-10-01 DIAGNOSIS — K219 Gastro-esophageal reflux disease without esophagitis: Secondary | ICD-10-CM | POA: Insufficient documentation

## 2024-10-01 NOTE — Assessment & Plan Note (Signed)
 Doing well on meds Monitor A1

## 2024-10-01 NOTE — Assessment & Plan Note (Signed)
 Worsening symptoms.  Prescribed pantoprazole  40 mg daily

## 2024-10-01 NOTE — Assessment & Plan Note (Signed)
 New.  Prescribed Z-Pak.  Hycodan cough syrup as needed

## 2024-10-06 ENCOUNTER — Other Ambulatory Visit: Payer: Self-pay | Admitting: Internal Medicine

## 2024-11-22 ENCOUNTER — Ambulatory Visit: Admitting: Internal Medicine

## 2024-11-22 ENCOUNTER — Encounter: Payer: Self-pay | Admitting: Internal Medicine

## 2024-11-22 VITALS — BP 138/80 | HR 60 | Temp 98.3°F | Ht 72.0 in | Wt 243.0 lb

## 2024-11-22 DIAGNOSIS — I2583 Coronary atherosclerosis due to lipid rich plaque: Secondary | ICD-10-CM | POA: Diagnosis not present

## 2024-11-22 DIAGNOSIS — E1165 Type 2 diabetes mellitus with hyperglycemia: Secondary | ICD-10-CM | POA: Diagnosis not present

## 2024-11-22 DIAGNOSIS — M79641 Pain in right hand: Secondary | ICD-10-CM | POA: Diagnosis not present

## 2024-11-22 DIAGNOSIS — E785 Hyperlipidemia, unspecified: Secondary | ICD-10-CM

## 2024-11-22 DIAGNOSIS — M79642 Pain in left hand: Secondary | ICD-10-CM | POA: Diagnosis not present

## 2024-11-22 LAB — COMPREHENSIVE METABOLIC PANEL WITH GFR
ALT: 25 U/L (ref 3–53)
AST: 18 U/L (ref 5–37)
Albumin: 4.6 g/dL (ref 3.5–5.2)
Alkaline Phosphatase: 36 U/L — ABNORMAL LOW (ref 39–117)
BUN: 17 mg/dL (ref 6–23)
CO2: 28 meq/L (ref 19–32)
Calcium: 10 mg/dL (ref 8.4–10.5)
Chloride: 102 meq/L (ref 96–112)
Creatinine, Ser: 1.03 mg/dL (ref 0.40–1.50)
GFR: 79.12 mL/min
Glucose, Bld: 151 mg/dL — ABNORMAL HIGH (ref 70–99)
Potassium: 4.1 meq/L (ref 3.5–5.1)
Sodium: 136 meq/L (ref 135–145)
Total Bilirubin: 0.5 mg/dL (ref 0.2–1.2)
Total Protein: 7.9 g/dL (ref 6.0–8.3)

## 2024-11-22 LAB — HEMOGLOBIN A1C: Hgb A1c MFr Bld: 7.3 % — ABNORMAL HIGH (ref 4.6–6.5)

## 2024-11-22 NOTE — Assessment & Plan Note (Signed)
 Doing well on meds Monitor A1

## 2024-11-22 NOTE — Progress Notes (Signed)
 "  Subjective:  Patient ID: John Kaiser, male    DOB: 16-Jun-1966  Age: 59 y.o. MRN: [id number]  CC: Follow-up   HPI John Kaiser presents for hand pain L>R F/u on DM, OA  Outpatient Medications Prior to Visit  Medication Sig Dispense Refill   Accu-Chek Softclix Lancets lancets USE TO CHECK BLOOD GLUCOSE LEVELS TWICE DAILY AS DIRECTED BY MD 100 each 12   Blood Glucose Monitoring Suppl (ACCU-CHEK AVIVA PLUS) w/Device KIT As directed 1 kit 0   celecoxib  (CELEBREX ) 200 MG capsule TAKE 1 CAPSULE BY MOUTH EVERY EVENING WITH DINNER 30 capsule 3   Cholecalciferol (VITAMIN D3) 50 MCG (2000 UT) capsule Take 2,000 Units by mouth daily.     ciclopirox  (PENLAC ) 8 % solution Apply topically at bedtime. Apply over nail and surrounding skin daily 6.6 mL 1   glucose blood (ACCU-CHEK GUIDE TEST) test strip USE TO CHECK BLOOD GLUCOSE TWICE DAILY AS DIRECTED 100 strip 6   metFORMIN  (GLUCOPHAGE ) 500 MG tablet TAKE 1 TABLET BY MOUTH DAILY WITH BREAKFAST 90 tablet 3   Multiple Vitamin (MULTI-VITAMIN) tablet Take 1 tablet by mouth daily.     pantoprazole  (PROTONIX ) 40 MG tablet Take 1 tablet (40 mg total) by mouth daily. 30 tablet 5   repaglinide  (PRANDIN ) 1 MG tablet Take 1 tablet (1 mg total) by mouth 3 (three) times daily before meals. 90 tablet 5   azithromycin  (ZITHROMAX  Z-PAK) 250 MG tablet As directed 6 tablet 0   HYDROcodone  bit-homatropine (HYCODAN) 5-1.5 MG/5ML syrup Take 5 mLs by mouth every 6 (six) hours as needed for cough. 240 mL 0   No facility-administered medications prior to visit.    ROS: Review of Systems  Constitutional:  Negative for appetite change, fatigue and unexpected weight change.  HENT:  Negative for congestion, nosebleeds, sneezing, sore throat and trouble swallowing.   Eyes:  Negative for itching and visual disturbance.  Respiratory:  Negative for cough.   Cardiovascular:  Negative for chest pain, palpitations and leg swelling.  Gastrointestinal:  Negative for abdominal  distention, blood in stool, diarrhea and nausea.  Genitourinary:  Negative for frequency and hematuria.  Musculoskeletal:  Positive for arthralgias. Negative for back pain, gait problem, joint swelling and neck pain.  Skin:  Negative for rash.  Neurological:  Negative for dizziness, tremors, speech difficulty and weakness.  Psychiatric/Behavioral:  Negative for agitation, decreased concentration, dysphoric mood, sleep disturbance and suicidal ideas. The patient is not nervous/anxious.     Objective:  BP 138/80 (BP Location: Left Arm, Patient Position: Sitting, Cuff Size: Normal)   Pulse 60   Temp 98.3 F (36.8 C) (Oral)   Ht 6' (1.829 m)   Wt 243 lb (110.2 kg)   SpO2 98%   BMI 32.96 kg/m   BP Readings from Last 3 Encounters:  11/22/24 138/80  09/12/24 122/86  08/22/24 (!) 142/82    Wt Readings from Last 3 Encounters:  11/22/24 243 lb (110.2 kg)  09/12/24 241 lb (109.3 kg)  08/22/24 242 lb 12.8 oz (110.1 kg)    Physical Exam Constitutional:      General: He is not in acute distress.    Appearance: He is well-developed. He is obese.     Comments: NAD  Eyes:     Conjunctiva/sclera: Conjunctivae normal.     Pupils: Pupils are equal, round, and reactive to light.  Neck:     Thyroid : No thyromegaly.     Vascular: No JVD.  Cardiovascular:     Rate  and Rhythm: Normal rate and regular rhythm.     Heart sounds: Normal heart sounds. No murmur heard.    No friction rub. No gallop.  Pulmonary:     Effort: Pulmonary effort is normal. No respiratory distress.     Breath sounds: Normal breath sounds. No wheezing or rales.  Chest:     Chest wall: No tenderness.  Abdominal:     General: Bowel sounds are normal. There is no distension.     Palpations: Abdomen is soft. There is no mass.     Tenderness: There is no abdominal tenderness. There is no guarding or rebound.  Musculoskeletal:        General: No tenderness. Normal range of motion.     Cervical back: Normal range of  motion.     Right lower leg: No edema.     Left lower leg: No edema.  Lymphadenopathy:     Cervical: No cervical adenopathy.  Skin:    General: Skin is warm and dry.     Findings: No rash.  Neurological:     Mental Status: He is alert and oriented to person, place, and time.     Cranial Nerves: No cranial nerve deficit.     Motor: No abnormal muscle tone.     Coordination: Coordination normal.     Gait: Gait normal.     Deep Tendon Reflexes: Reflexes are normal and symmetric.  Psychiatric:        Behavior: Behavior normal.        Thought Content: Thought content normal.        Judgment: Judgment normal.     Lab Results  Component Value Date   WBC 8.1 11/12/2022   HGB 16.5 11/12/2022   HCT 47.6 11/12/2022   PLT 258.0 11/12/2022   GLUCOSE 155 (H) 05/15/2024   CHOL 282 (H) 11/12/2022   TRIG 331.0 (H) 11/12/2022   HDL 37.90 (L) 11/12/2022   LDLDIRECT 196.0 11/12/2022   ALT 22 05/15/2024   AST 17 05/15/2024   NA 136 05/15/2024   K 4.4 05/15/2024   CL 102 05/15/2024   CREATININE 1.07 05/15/2024   BUN 14 05/15/2024   CO2 26 05/15/2024   TSH 1.77 08/22/2024   PSA 2.78 11/12/2022   HGBA1C 7.0 (H) 08/22/2024    CT CARDIAC SCORING (SELF PAY ONLY) Addendum Date: 11/17/2022 ADDENDUM REPORT: 11/17/2022 10:29 EXAM: OVER-READ INTERPRETATION  CT CHEST The following report is a limited chest CT over-read performed by radiologist Dr. Elsie Ko [facility] on 11/17/2022. This over-read does not include interpretation of cardiac or coronary anatomy or pathology. The coronary calcium  score interpretation by the cardiologist is attached. COMPARISON:  None. FINDINGS: Mediastinum/Nodes: No enlarged lymph nodes within the visualized mediastinum. Lungs/Pleura: There is no pleural effusion. Small calcified granuloma in the lingula. The visualized lungs are otherwise clear. Upper abdomen: Probable hepatic steatosis. Musculoskeletal/Chest wall: No chest wall mass or suspicious  osseous findings within the visualized chest. Mild thoracic spondylosis. IMPRESSION: 1. No significant extracardiac findings within the visualized lower chest. 2. Probable hepatic steatosis. Electronically Signed   By: Elsie Perone M.D.   On: 11/17/2022 10:29   Result Date: 11/17/2022 CLINICAL DATA:  Cardiovascular Disease Risk stratification EXAM: Coronary Calcium  Score TECHNIQUE: A gated, non-contrast computed tomography scan of the heart was performed using 3mm slice thickness. Axial images were analyzed on a dedicated workstation. Calcium  scoring of the coronary arteries was performed using the Agatston method. FINDINGS: Coronary Calcium  Score: Left main: 0 Left  anterior descending artery: 5.19 Left circumflex artery: 6.71 Right coronary artery: 0 Total: 11.9 Percentile: 46 Pericardium: Normal. Ascending Aorta: Normal caliber.  Mild aortic atherosclerosis. Non-cardiac: See separate report from [facility]. IMPRESSION: Coronary calcium  score of 11.9. This was 45 percentile for age-, race-, and sex-matched controls. Mild aortic atherosclerosis. RECOMMENDATIONS: Coronary artery calcium  (CAC) score is a strong predictor of incident coronary heart disease (CHD) and provides predictive information beyond traditional risk factors. CAC scoring is reasonable to use in the decision to withhold, postpone, or initiate statin therapy in intermediate-risk or selected borderline-risk asymptomatic adults (age 73-75 years and LDL-C >=70 to <190 mg/dL) who do not have diabetes or established atherosclerotic cardiovascular disease (ASCVD).* In intermediate-risk (10-year ASCVD risk >=7.5% to <20%) adults or selected borderline-risk (10-year ASCVD risk >=5% to <7.5%) adults in whom a CAC score is measured for the purpose of making a treatment decision the following recommendations have been made: If CAC=0, it is reasonable to withhold statin therapy and reassess in 5 to 10 years, as long as higher risk conditions are  absent (diabetes mellitus, family history of premature CHD in first degree relatives (males <55 years; females <65 years), cigarette smoking, or LDL >=190 mg/dL). If CAC is 1 to 99, it is reasonable to initiate statin therapy for patients >=59 years of age. If CAC is >=100 or >=75th percentile, it is reasonable to initiate statin therapy at any age. Cardiology referral should be considered for patients with CAC scores >=400 or >=75th percentile. *2018 AHA/ACC/AACVPR/AAPA/ABC/ACPM/ADA/AGS/APhA/ASPC/NLA/PCNA Guideline on the Management of Blood Cholesterol: A Report of the American College of Cardiology/American Heart Association Task Force on Clinical Practice Guidelines. J Am Coll Cardiol. 2019;73(24):3168-3209. Redell Shallow, MD Electronically Signed: By: Redell Shallow M.D. 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     Follow-up: Return in about 3 months (around 02/20/2025) for a follow-up visit.  Marolyn Noel, MD "

## 2024-11-22 NOTE — Assessment & Plan Note (Signed)
 Treat DM, lipids, wt loss

## 2024-11-22 NOTE — Assessment & Plan Note (Signed)
 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

## 2024-11-22 NOTE — Assessment & Plan Note (Signed)
 Fish oil and aspirin

## 2024-11-23 ENCOUNTER — Ambulatory Visit: Payer: Self-pay | Admitting: Internal Medicine

## 2024-11-23 MED ORDER — METFORMIN HCL 500 MG PO TABS: 500.0000 mg | ORAL_TABLET | Freq: Two times a day (BID) | ORAL | 3 refills | Status: AC

## 2025-02-20 ENCOUNTER — Ambulatory Visit: Admitting: Internal Medicine
